# Patient Record
Sex: Male | Born: 1975 | Race: White | Hispanic: Yes | Marital: Single | Smoking: Light tobacco smoker
Health system: Southern US, Community
[De-identification: ages and names within clinical notes are randomized; demographics above are authoritative.]

## PROBLEM LIST (undated history)

## (undated) DIAGNOSIS — IMO0001 Reserved for inherently not codable concepts without codable children: Secondary | ICD-10-CM

## (undated) DIAGNOSIS — Z86010 Personal history of colonic polyps: Secondary | ICD-10-CM

## (undated) DIAGNOSIS — G8929 Other chronic pain: Secondary | ICD-10-CM

## (undated) DIAGNOSIS — M542 Cervicalgia: Secondary | ICD-10-CM

## (undated) DIAGNOSIS — E669 Obesity, unspecified: Secondary | ICD-10-CM

## (undated) DIAGNOSIS — E291 Testicular hypofunction: Secondary | ICD-10-CM

## (undated) DIAGNOSIS — T7840XA Allergy, unspecified, initial encounter: Secondary | ICD-10-CM

## (undated) DIAGNOSIS — R609 Edema, unspecified: Secondary | ICD-10-CM

## (undated) DIAGNOSIS — D649 Anemia, unspecified: Secondary | ICD-10-CM

## (undated) DIAGNOSIS — E785 Hyperlipidemia, unspecified: Secondary | ICD-10-CM

## (undated) DIAGNOSIS — F329 Major depressive disorder, single episode, unspecified: Secondary | ICD-10-CM

## (undated) DIAGNOSIS — J45909 Unspecified asthma, uncomplicated: Secondary | ICD-10-CM

## (undated) DIAGNOSIS — E1169 Type 2 diabetes mellitus with other specified complication: Secondary | ICD-10-CM

## (undated) DIAGNOSIS — M1990 Unspecified osteoarthritis, unspecified site: Secondary | ICD-10-CM

## (undated) DIAGNOSIS — E039 Hypothyroidism, unspecified: Secondary | ICD-10-CM

## (undated) DIAGNOSIS — M1A9XX0 Chronic gout, unspecified, without tophus (tophi): Secondary | ICD-10-CM

## (undated) HISTORY — DX: Hypothyroidism, unspecified: E03.9

## (undated) HISTORY — DX: Obesity, unspecified: E66.9

## (undated) HISTORY — DX: Reserved for inherently not codable concepts without codable children: IMO0001

## (undated) HISTORY — DX: Cervicalgia: M54.2

## (undated) HISTORY — DX: Other chronic pain: G89.29

## (undated) HISTORY — DX: Chronic gout, unspecified, without tophus (tophi): M1A.9XX0

## (undated) HISTORY — PX: SHOULDER SURGERY: SHX246

## (undated) HISTORY — DX: Unspecified osteoarthritis, unspecified site: M19.90

## (undated) HISTORY — DX: Type 2 diabetes mellitus with other specified complication: E11.69

## (undated) HISTORY — DX: Personal history of colonic polyps: Z86.010

## (undated) HISTORY — DX: Testicular hypofunction: E29.1

## (undated) HISTORY — DX: Allergy, unspecified, initial encounter: T78.40XA

## (undated) HISTORY — DX: Anemia, unspecified: D64.9

## (undated) HISTORY — DX: Unspecified asthma, uncomplicated: J45.909

## (undated) HISTORY — DX: Major depressive disorder, single episode, unspecified: F32.9

## (undated) HISTORY — PX: TONSILLECTOMY: SUR1361

## (undated) HISTORY — DX: Edema, unspecified: R60.9

## (undated) HISTORY — DX: Hyperlipidemia, unspecified: E78.5

---

## 2002-02-24 ENCOUNTER — Emergency Department (HOSPITAL_COMMUNITY): Admission: EM | Admit: 2002-02-24 | Discharge: 2002-02-24 | Payer: Self-pay | Admitting: Emergency Medicine

## 2002-02-24 ENCOUNTER — Encounter: Payer: Self-pay | Admitting: Emergency Medicine

## 2007-05-18 HISTORY — PX: SPINE SURGERY: SHX786

## 2008-07-30 ENCOUNTER — Emergency Department (HOSPITAL_COMMUNITY): Admission: EM | Admit: 2008-07-30 | Discharge: 2008-07-30 | Payer: Self-pay | Admitting: Emergency Medicine

## 2008-10-07 ENCOUNTER — Ambulatory Visit (HOSPITAL_COMMUNITY): Admission: RE | Admit: 2008-10-07 | Discharge: 2008-10-08 | Payer: Self-pay | Admitting: Neurological Surgery

## 2010-08-25 LAB — CBC
HCT: 41.7 % (ref 39.0–52.0)
Hemoglobin: 14.6 g/dL (ref 13.0–17.0)
MCHC: 35 g/dL (ref 30.0–36.0)
MCV: 88.6 fL (ref 78.0–100.0)
Platelets: 174 10*3/uL (ref 150–400)
RBC: 4.71 MIL/uL (ref 4.22–5.81)
RDW: 12.7 % (ref 11.5–15.5)
WBC: 5.8 10*3/uL (ref 4.0–10.5)

## 2010-08-27 LAB — DIFFERENTIAL
Eosinophils Relative: 1 % (ref 0–5)
Lymphocytes Relative: 20 % (ref 12–46)
Lymphs Abs: 1.5 10*3/uL (ref 0.7–4.0)
Monocytes Relative: 6 % (ref 3–12)

## 2010-08-27 LAB — CBC
HCT: 41 % (ref 39.0–52.0)
Hemoglobin: 14.4 g/dL (ref 13.0–17.0)
MCHC: 35 g/dL (ref 30.0–36.0)
MCV: 89.2 fL (ref 78.0–100.0)
Platelets: 223 10*3/uL (ref 150–400)
RBC: 4.6 MIL/uL (ref 4.22–5.81)
RDW: 12.4 % (ref 11.5–15.5)
WBC: 7.3 10*3/uL (ref 4.0–10.5)

## 2010-08-27 LAB — POCT I-STAT, CHEM 8
BUN: 10 mg/dL (ref 6–23)
Creatinine, Ser: 0.8 mg/dL (ref 0.4–1.5)
Glucose, Bld: 122 mg/dL — ABNORMAL HIGH (ref 70–99)
Potassium: 3.2 mEq/L — ABNORMAL LOW (ref 3.5–5.1)
Sodium: 140 mEq/L (ref 135–145)
TCO2: 27 mmol/L (ref 0–100)

## 2010-08-27 LAB — POCT CARDIAC MARKERS
CKMB, poc: <1 ng/mL — ABNORMAL LOW (ref 1.0–8.0)
Myoglobin, poc: 72.1 ng/mL (ref 12–200)

## 2010-09-29 NOTE — Op Note (Signed)
NAME:  Jeremiah Long, Jeremiah Long NO.:  [id number]   MEDICAL RECORD NO.:  [id number]          PATIENT TYPE:  OIB   LOCATION:  3524                         FACILITY:  [facility]   PHYSICIAN:  Stefani Dama, M.D.  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   POSTOPERATIVE DIAGNOSIS:  Herniated nucleus pulposus L4-L5 left with  left lumbar radiculopathy.   OPERATIONS:  Microdiskectomy L4-L5 left with operating microscope  microdissection technique.   SURGEON:  Barnett Abu, M.D.   FIRST ASSISTANT:  Danae Orleans. Venetia Maxon, M.D.   ANESTHESIA:  General endotracheal.   INDICATIONS:  Mr. Ratterman is a 35 year old individual who has had  significant back and left lower extremity pain.  This started back in  January and evidence of a large extruded fragment disk at L4-L5 on the  left side.  Despite efforts at conservative management, his symptoms  initially improved, but he has been left with some chronic back pain,  buttock pain and pain occasional radiating into the left lower  extremity, particularly when he stands for any length of time.  Having  failed efforts at conservative management over the past months, he has  been advised regarding surgical decompression of the disk at L4-L5 on  the left.   PROCEDURE:  The patient was brought to the operating room, placed on the  stretcher in the supine position.  After smooth induction of general  endotracheal anesthesia, he was turned prone onto the operating table.  The back was carefully padded and protected and the back was then  prepped with alcohol and DuraPrep and draped in sterile fashion.  After  localizing the region of L4-L5 by palpation, a spinal needle was placed  to verify the localization radiographically and indeed the interlaminar  space was  noted at L4-L5.  A small vertical incision was created in the  midline of lumbar spine.  This was carried down to lumbodorsal fascia,  which was opened on the left side of midline.  Subperiosteal dissection  was then performed in the interlaminar space that was marked as L4-L5.  Self-retaining Caspar retractor was then placed into the wound and  dissection was then carried cephalad and caudad to expose the  interlaminar space fully.  Second verifying x-ray was obtained with an  instrument in the interspace at L4-L5.  Then, laminotomy was created  removing the inferior margin lamina of L4 out to the medial wall of  facet, partial medial facetectomy was performed in this region.  The  superior arch of L5 was similarly resected and the common dural tube was  exposed along with takeoff the L5 nerve root which was noted to be bowed  dorsally and laterally.  By dissecting the nerve root medially, there  was noted to be a mass of disk material that was unplugged to the  vertebral body.  This was carefully dissected free and removed.  This  allowed better egress of the L5 nerve root.  Medially,  there was noted  to be some other fragments of disk.  Behind the body of L5 were  partially adherent to the dura and also confined by epidural veins.  Once this was resected, some brisk bleeding was encountered which was  controlled with some bipolar cautery and some pledgets of Gelfoam soaked  in thrombin.  The disk space was then explored.  There was noted to be  an opening on the inferior medial aspect of the space.  A #15 blade was  used to increase the size of the opening and then resect significant  quantities of significantly degenerated disk material from within the  disk space, both medial and lateral dissection was performed, and once  this was completed 2 and 3 mm Kerrison punch was used to dress the edges  of the posterior longitudinal ligament in this area.  The common dural  tube and the L5  nerve root were well decompressed.  The disk space was  evacuated and this was checked by repeated irrigation and probing with  several sizes of blunt nerve hooks and once this was verified, the  interspace was decompressed.  Retractors were removed.  Microscope was  removed.  The lumbodorsal fascia was closed with #1 Vicryl in  interrupted fashion, 2-0 Vicryl using subcutaneous tissues, 3-0 Vicryl  subcuticularly and Dermabond was placed on the skin.  Blood loss for the  procedure was estimated at 50 mL.  The patient tolerated the procedure  well.      Stefani Dama, M.D.  Electronically Signed     HJE/MEDQ  D:  10/07/2008  T:  10/08/2008  Job:  [id number]

## 2011-11-02 ENCOUNTER — Encounter: Payer: Self-pay | Admitting: Family Medicine

## 2011-11-02 ENCOUNTER — Ambulatory Visit (INDEPENDENT_AMBULATORY_CARE_PROVIDER_SITE_OTHER): Admitting: Family Medicine

## 2011-11-02 VITALS — BP 132/81 | HR 92 | Ht 71.0 in | Wt 280.0 lb

## 2011-11-02 DIAGNOSIS — Z86010 Personal history of colonic polyps: Secondary | ICD-10-CM

## 2011-11-02 DIAGNOSIS — E039 Hypothyroidism, unspecified: Secondary | ICD-10-CM | POA: Insufficient documentation

## 2011-11-02 DIAGNOSIS — M1A9XX0 Chronic gout, unspecified, without tophus (tophi): Secondary | ICD-10-CM | POA: Insufficient documentation

## 2011-11-02 DIAGNOSIS — E669 Obesity, unspecified: Secondary | ICD-10-CM

## 2011-11-02 DIAGNOSIS — E119 Type 2 diabetes mellitus without complications: Secondary | ICD-10-CM

## 2011-11-02 DIAGNOSIS — E785 Hyperlipidemia, unspecified: Secondary | ICD-10-CM

## 2011-11-02 DIAGNOSIS — E1169 Type 2 diabetes mellitus with other specified complication: Secondary | ICD-10-CM

## 2011-11-02 DIAGNOSIS — Z860101 Personal history of adenomatous and serrated colon polyps: Secondary | ICD-10-CM

## 2011-11-02 DIAGNOSIS — E291 Testicular hypofunction: Secondary | ICD-10-CM

## 2011-11-02 DIAGNOSIS — E876 Hypokalemia: Secondary | ICD-10-CM

## 2011-11-02 DIAGNOSIS — M542 Cervicalgia: Secondary | ICD-10-CM

## 2011-11-02 DIAGNOSIS — M1A00X0 Idiopathic chronic gout, unspecified site, without tophus (tophi): Secondary | ICD-10-CM

## 2011-11-02 HISTORY — DX: Type 2 diabetes mellitus with other specified complication: E11.69

## 2011-11-02 HISTORY — DX: Hypothyroidism, unspecified: E03.9

## 2011-11-02 HISTORY — DX: Type 2 diabetes mellitus with other specified complication: E66.9

## 2011-11-02 HISTORY — DX: Chronic gout, unspecified, without tophus (tophi): M1A.9XX0

## 2011-11-02 HISTORY — DX: Testicular hypofunction: E29.1

## 2011-11-02 HISTORY — DX: Personal history of colonic polyps: Z86.010

## 2011-11-02 HISTORY — DX: Personal history of adenomatous and serrated colon polyps: Z86.0101

## 2011-11-02 HISTORY — DX: Hyperlipidemia, unspecified: E78.5

## 2011-11-02 LAB — POCT GLYCOSYLATED HEMOGLOBIN (HGB A1C): Hemoglobin A1C: 6.4

## 2011-11-02 MED ORDER — MELOXICAM 15 MG PO TABS: 15.0000 mg | ORAL_TABLET | Freq: Every day | ORAL | Status: DC

## 2011-11-02 MED ORDER — COLESEVELAM HCL 625 MG PO TABS: 1875.0000 mg | ORAL_TABLET | Freq: Two times a day (BID) | ORAL | Status: DC

## 2011-11-02 MED ORDER — COLCHICINE 0.6 MG PO TABS: 0.6000 mg | ORAL_TABLET | Freq: Every day | ORAL | Status: DC

## 2011-11-02 MED ORDER — METFORMIN HCL ER (OSM) 1000 MG PO TB24: 2000.0000 mg | ORAL_TABLET | Freq: Every day | ORAL | Status: DC

## 2011-11-02 MED ORDER — POTASSIUM CHLORIDE 20 MEQ PO PACK: 40.0000 meq | PACK | Freq: Two times a day (BID) | ORAL | Status: DC

## 2011-11-02 MED ORDER — ORPHENADRINE CITRATE ER 100 MG PO TB12: 100.0000 mg | ORAL_TABLET | Freq: Two times a day (BID) | ORAL | Status: DC

## 2011-11-02 MED ORDER — TESTOSTERONE CYPIONATE 200 MG/ML IM SOLN: 200.0000 mg | INTRAMUSCULAR | Status: DC

## 2011-11-02 MED ORDER — FEBUXOSTAT 80 MG PO TABS: 30.0000 mg | ORAL_TABLET | ORAL | Status: DC

## 2011-11-02 MED ORDER — LEVOTHYROXINE SODIUM 150 MCG PO TABS: 150.0000 ug | ORAL_TABLET | Freq: Every day | ORAL | Status: DC

## 2011-11-02 NOTE — Progress Notes (Signed)
Subjective:    Patient ID: Jeremiah Long, male    DOB: 05/29/1975, 36 y.o.   MRN: [id number]  HPI Patient is a new patient here with multiple issues. #1 Lyme's disease. He was treated with several cyclic antibiotic trials for late systemic Lyme's disease. He states when he was initially treated about 6 months ago he felt better but since then he has felt somewhat fatigued he's had before and questions whether he should be retreated. He is a lot of lab work including Lyme's disease titers which he says looks better but still there is some seropositive findings present. #2 hypokalemia potassium on the lab work is low 3.2 he is taking potassium 2 tablets daily. He is also taking Lasix 40 mg one to 2 tablets every morning for persistent and recurrent edema #3 elevated blood sugar. He is on long-acting metformin but only 750 mg twice a day. It is it looks like he may been on 6 pills a day but he does the cyst is only taking 2 #4 history of hypothyroidism. He's taking Armour Thyroid and another thyroid supplement as well. He has been going to a wellness clinic and weight loss clinic. States that the weight loss was good initially but then plateaued and the NP there had him on 2 thyroid medications. #5 failed weight loss. States the weight loss is no longer coming off as it did before. He has been taking phentermine, HCG injections on a cycle. He states because of his chronic pain he is unable to exercise on a regular basis. #6 hypogonadism. He is on an extensive dose of testosterone which is injecting twice a week and showed that his testosterone levels are now adequate but he is also taking Cytomel 25 mcg 2 tablets twice a day and antiestrogen preparation #7 chronic pain. He states that the Flexeril and tramadol no longer works for his chronic pain. Should be noted he does have a history of according to him bulging disc of the neck and he has had back surgery as well. He states that the orthopedic"cut  him off"of his pain medication years ago. #8 in reviewing his labs He is uric acid greater than 11 and he's had swelling of his hand arms and legs but is not taking anything for gout at this time.  #9 fatigue. He reports taking B12 injections twice a week to improve his stamina and his fatigue he states he does not mind injecting himself twice a week for that. #10 hyperlipidemia in particular elevated triglycerides. He is not on any treatment for that issue at this time. #11 rash on his chest and arm.   Review of Systems  Constitutional: Positive for activity change, appetite change, fatigue and unexpected weight change.  Musculoskeletal: Positive for myalgias and arthralgias.  Skin: Positive for rash.  Neurological: Positive for weakness, light-headedness and headaches.      BP 132/81  Pulse 92  Ht 5\' 11"  (1.803 m)  Wt 280 lb (127.007 kg)  BMI 39.05 kg/m2 Objective:   Physical Exam  Constitutional: He is oriented to person, place, and time. He appears well-developed and well-nourished.  HENT:  Head: Normocephalic and atraumatic.  Eyes: Pupils are equal, round, and reactive to light.  Neck: Normal range of motion. Neck supple. No tracheal deviation present. No thyromegaly present.  Cardiovascular: Normal rate, regular rhythm and normal heart sounds.  Exam reveals no friction rub.   No murmur heard. Pulmonary/Chest: Effort normal and breath sounds normal. No respiratory distress. He has  no wheezes.  Musculoskeletal: Normal range of motion. He exhibits no edema.  Neurological: He is alert and oriented to person, place, and time.  Skin: Rash noted. There is erythema.       Acne like lesions on both arms chest and abdomen  Psychiatric: He has a normal mood and affect. His behavior is normal.       Lab Results  Component Value Date   HGBA1C 6.4 11/02/2011   Assessment & Plan:     Itch#13####  #1 Lyme's disease and exposure. Would not deny that he has had Lyme's disease the  question is does he still required treatment and so how much. We will await until his next visit to make that determination.  #2 will allow him for the time being to continue with his Lasix 40 mg one to 2 tablets daily but we will increase his potassium to 2 tablets twice a day and the next time blood work was drawn may need to include a magnesium level as well. #3 elevated blood sugar. He was told that he is a diabetic with an A1c on medication of 6.4 he meets the criteria. We'll increase his Fortamet from 750 twice a day to two 1000 mg tablets of Fortamet daily. He may be a candidate for Victoza later. #4 the treatment of hypothyroidism with 2 thyroid preparations goes against my training in medicine. Stop the Armour and the Cytomel now and we'll place on Synthroid 150 mcg a day. #5 failed weight loss/obesity. No phentermine, no hCG at this time and he would not be able to get hCG from me #6 hypogonadism. Allow current testosterone dosing at this time twice a week and we'll need to monitor testosterone levels but I would not approve a known him continuing the antiestrogen medication unless he is transgender. #7 chronic pain. Stop the Flexeril continue the tramadol and add Mobic 15 mg daily as well as Norflex 100 mg twice a day we'll see him pain does next month.  #8 gout. He meets the criteria of joint pain hand pain arm pain knee pain which is episodic and a uric acid greater than 11. We'll place him on colchicine for least 2 months one tablet once or twice a day warned of the problems of diarrhea and place him on Uloric 80 mg daily with coupons. Stress the need to take colchicine for least the first 2 months of taking Uloric. #9 at this point time fatigue may be secondary to some of the medications were taking him off no new changes at this time. #10 hyperlipidemia/hypertriglyceridemia. We'll place him on WelChol 625 mg 2 capsules 3 times a day and recheck this in appropriate time. Explained to him and  his wife that the WelChol she also help with his diabetes. #11 rash on chest may be secondary to hormonal treatment since this is being changed no new treatment for this at this time. Should be noted that the complexity and multiple problems over half the time was spent in counseling and explaining the changes Been done Patient as leaving request a GI referral for his history of colonic adenomatous polyps that is due for repeat colonoscopy now.

## 2011-11-02 NOTE — Patient Instructions (Signed)
Gout Gout is an inflammatory condition (arthritis) caused by a buildup of uric acid crystals in the joints. Uric acid is a chemical that is normally present in the blood. Under some circumstances, uric acid can form into crystals in your joints. This causes joint redness, soreness, and swelling (inflammation). Repeat attacks are common. Over time, uric acid crystals can form into masses (tophi) near a joint, causing disfigurement. Gout is treatable and often preventable. CAUSES  The disease begins with elevated levels of uric acid in the blood. Uric acid is produced by your body when it breaks down a naturally found substance called purines. This also happens when you eat certain foods such as meats and fish. Causes of an elevated uric acid level include:  Being passed down from parent to child (heredity).   Diseases that cause increased uric acid production (obesity, psoriasis, some cancers).   Excessive alcohol use.   Diet, especially diets rich in meat and seafood.   Medicines, including certain cancer-fighting drugs (chemotherapy), diuretics, and aspirin.   Chronic kidney disease. The kidneys are no longer able to remove uric acid well.   Problems with metabolism.  Conditions strongly associated with gout include:  Obesity.   High blood pressure.   High cholesterol.   Diabetes.  Not everyone with elevated uric acid levels gets gout. It is not understood why some people get gout and others do not. Surgery, joint injury, and eating too much of certain foods are some of the factors that can lead to gout. SYMPTOMS   An attack of gout comes on quickly. It causes intense pain with redness, swelling, and warmth in a joint.   Fever can occur.   Often, only one joint is involved. Certain joints are more commonly involved:   Base of the big toe.   Knee.   Ankle.   Wrist.   Finger.  Without treatment, an attack usually goes away in a few days to weeks. Between attacks, you  usually will not have symptoms, which is different from many other forms of arthritis. DIAGNOSIS  Your caregiver will suspect gout based on your symptoms and exam. Removal of fluid from the joint (arthrocentesis) is done to check for uric acid crystals. Your caregiver will give you a medicine that numbs the area (local anesthetic) and use a needle to remove joint fluid for exam. Gout is confirmed when uric acid crystals are seen in joint fluid, using a special microscope. Sometimes, blood, urine, and X-ray tests are also used. TREATMENT  There are 2 phases to gout treatment: treating the sudden onset (acute) attack and preventing attacks (prophylaxis). Treatment of an Acute Attack  Medicines are used. These include anti-inflammatory medicines or steroid medicines.   An injection of steroid medicine into the affected joint is sometimes necessary.   The painful joint is rested. Movement can worsen the arthritis.   You may use warm or cold treatments on painful joints, depending which works best for you.   Discuss the use of coffee, vitamin C, or cherries with your caregiver. These may be helpful treatment options.  Treatment to Prevent Attacks After the acute attack subsides, your caregiver may advise prophylactic medicine. These medicines either help your kidneys eliminate uric acid from your body or decrease your uric acid production. You may need to stay on these medicines for a very long time. The early phase of treatment with prophylactic medicine can be associated with an increase in acute gout attacks. For this reason, during the first few months   of treatment, your caregiver may also advise you to take medicines usually used for acute gout treatment. Be sure you understand your caregiver's directions. You should also discuss dietary treatment with your caregiver. Certain foods such as meats and fish can increase uric acid levels. Other foods such as dairy can decrease levels. Your caregiver  can give you a list of foods to avoid. HOME CARE INSTRUCTIONS   Do not take aspirin to relieve pain. This raises uric acid levels.   Only take over-the-counter or prescription medicines for pain, discomfort, or fever as directed by your caregiver.   Rest the joint as much as possible. When in bed, keep sheets and blankets off painful areas.   Keep the affected joint raised (elevated).   Use crutches if the painful joint is in your leg.   Drink enough water and fluids to keep your urine clear or pale yellow. This helps your body get rid of uric acid. Do not drink alcoholic beverages. They slow the passage of uric acid.   Follow your caregiver's dietary instructions. Pay careful attention to the amount of protein you eat. Your daily diet should emphasize fruits, vegetables, whole grains, and fat-free or low-fat milk products.   Maintain a healthy body weight.  SEEK MEDICAL CARE IF:   You have an oral temperature above 102 F (38.9 C).   You develop diarrhea, vomiting, or any side effects from medicines.   You do not feel better in 24 hours, or you are getting worse.  SEEK IMMEDIATE MEDICAL CARE IF:   Your joint becomes suddenly more tender and you have:   Chills.   An oral temperature above 102 F (38.9 C), not controlled by medicine.  MAKE SURE YOU:   Understand these instructions.   Will watch your condition.   Will get help right away if you are not doing well or get worse.  Document Released: 04/30/2000 Document Revised: 04/22/2011 Document Reviewed: 08/11/2009 [facility] Patient Information 2012 [place], [place].Hypothyroidism The thyroid is a large gland located in the lower front of your neck. The thyroid gland helps control metabolism. Metabolism is how your body handles food. It controls metabolism with the hormone thyroxine. When this gland is underactive (hypothyroid), it produces too little hormone.  CAUSES These include:   Absence or destruction of thyroid  tissue.   Goiter due to iodine deficiency.   Goiter due to medications.   Congenital defects (since birth).   Problems with the pituitary. This causes a lack of TSH (thyroid stimulating hormone). This hormone tells the thyroid to turn out more hormone.  SYMPTOMS  Lethargy (feeling as though you have no energy)   Cold intolerance   Weight gain (in spite of normal food intake)   Dry skin   Coarse hair   Menstrual irregularity (if severe, may lead to infertility)   Slowing of thought processes  Cardiac problems are also caused by insufficient amounts of thyroid hormone. Hypothyroidism in the newborn is cretinism, and is an extreme form. It is important that this form be treated adequately and immediately or it will lead rapidly to retarded physical and mental development. DIAGNOSIS  To prove hypothyroidism, your caregiver may do blood tests and ultrasound tests. Sometimes the signs are hidden. It may be necessary for your caregiver to watch this illness with blood tests either before or after diagnosis and treatment. TREATMENT  Low levels of thyroid hormone are increased by using synthetic thyroid hormone. This is a safe, effective treatment. It usually takes about four  weeks to gain the full effects of the medication. After you have the full effect of the medication, it will generally take another four weeks for problems to leave. Your caregiver may start you on low doses. If you have had heart problems the dose may be gradually increased. It is generally not an emergency to get rapidly to normal. HOME CARE INSTRUCTIONS   Take your medications as your caregiver suggests. Let your caregiver know of any medications you are taking or start taking. Your caregiver will help you with dosage schedules.   As your condition improves, your dosage needs may increase. It will be necessary to have continuing blood tests as suggested by your caregiver.   Report all suspected medication side  effects to your caregiver.  SEEK MEDICAL CARE IF: Seek medical care if you develop:  Sweating.   Tremulousness (tremors).   Anxiety.   Rapid weight loss.   Heat intolerance.   Emotional swings.   Diarrhea.   Weakness.  SEEK IMMEDIATE MEDICAL CARE IF:  You develop chest pain, an irregular heart beat (palpitations), or a rapid heart beat. MAKE SURE YOU:   Understand these instructions.   Will watch your condition.   Will get help right away if you are not doing well or get worse.  Document Released: 05/03/2005 Document Revised: 04/22/2011 Document Reviewed: 12/22/2007 [facility] Patient Information 2012 [place], [place].

## 2011-12-02 ENCOUNTER — Ambulatory Visit: Admitting: Family Medicine

## 2011-12-02 ENCOUNTER — Encounter: Payer: Self-pay | Admitting: Family Medicine

## 2011-12-02 ENCOUNTER — Ambulatory Visit (INDEPENDENT_AMBULATORY_CARE_PROVIDER_SITE_OTHER): Admitting: Family Medicine

## 2011-12-02 ENCOUNTER — Other Ambulatory Visit: Payer: Self-pay | Admitting: *Deleted

## 2011-12-02 VITALS — BP 136/77 | HR 85 | Wt 276.0 lb

## 2011-12-02 DIAGNOSIS — M1A9XX0 Chronic gout, unspecified, without tophus (tophi): Secondary | ICD-10-CM

## 2011-12-02 DIAGNOSIS — E119 Type 2 diabetes mellitus without complications: Secondary | ICD-10-CM

## 2011-12-02 DIAGNOSIS — M1A00X0 Idiopathic chronic gout, unspecified site, without tophus (tophi): Secondary | ICD-10-CM

## 2011-12-02 DIAGNOSIS — R5381 Other malaise: Secondary | ICD-10-CM

## 2011-12-02 DIAGNOSIS — G8929 Other chronic pain: Secondary | ICD-10-CM

## 2011-12-02 DIAGNOSIS — E669 Obesity, unspecified: Secondary | ICD-10-CM

## 2011-12-02 DIAGNOSIS — E291 Testicular hypofunction: Secondary | ICD-10-CM

## 2011-12-02 DIAGNOSIS — E785 Hyperlipidemia, unspecified: Secondary | ICD-10-CM

## 2011-12-02 DIAGNOSIS — R5383 Other fatigue: Secondary | ICD-10-CM

## 2011-12-02 DIAGNOSIS — M542 Cervicalgia: Secondary | ICD-10-CM

## 2011-12-02 DIAGNOSIS — IMO0001 Reserved for inherently not codable concepts without codable children: Secondary | ICD-10-CM

## 2011-12-02 LAB — POCT CBG (FASTING - GLUCOSE)-MANUAL ENTRY: Glucose Fasting, POC: 82 mg/dL (ref 70–99)

## 2011-12-02 MED ORDER — PHENTERMINE HCL 37.5 MG PO CAPS: 37.5000 mg | ORAL_CAPSULE | ORAL | Status: DC

## 2011-12-02 MED ORDER — FEBUXOSTAT 80 MG PO TABS: 80.0000 mg | ORAL_TABLET | ORAL | Status: DC

## 2011-12-02 MED ORDER — FUROSEMIDE 40 MG PO TABS: 40.0000 mg | ORAL_TABLET | Freq: Every day | ORAL | Status: DC | PRN

## 2011-12-02 MED ORDER — FEBUXOSTAT 80 MG PO TABS: 80.0000 mg | ORAL_TABLET | Freq: Every day | ORAL | Status: DC

## 2011-12-02 MED ORDER — HYDROCODONE-ACETAMINOPHEN 5-325 MG PO TABS: 1.0000 | ORAL_TABLET | Freq: Three times a day (TID) | ORAL | Status: AC | PRN

## 2011-12-02 NOTE — Patient Instructions (Signed)
Chronic Back Pain When back pain lasts longer than 3 months, it is called chronic back pain.This pain can be frustrating, but the cause of the pain is rarely dangerous.People with chronic back pain often go through certain periods that are more intense (flare-ups). CAUSES Chronic back pain can be caused by wear and tear (degeneration) on different structures in your back. These structures may include bones, ligaments, or discs. This degeneration may result in more pressure being placed on the nerves that travel to your legs and feet. This can lead to pain traveling from the low back down the back of the legs. When pain lasts longer than 3 months, it is not unusual for people to experience anxiety or depression. Anxiety and depression can also contribute to low back pain. TREATMENT  Establish a regular exercise plan. This is critical to improving your functional level.   Have a self-management plan for when you flare-up. Flare-ups rarely require a medical visit. Regular exercise will help reduce the intensity and frequency of your flare-ups.   Manage how you feel about your back pain and the rest of your life. Anxiety, depression, and feeling that you cannot alter your back pain have been shown to make back pain more intense and debilitating.   Medicines should never be your only treatment. They should be used along with other treatments to help you return to a more active lifestyle.   Procedures such as injections or surgery may be helpful but are rarely necessary. You may be able to get the same results with physical therapy or chiropractic care.  HOME CARE INSTRUCTIONS  Avoid bending, heavy lifting, prolonged sitting, and activities which make the problem worse.   Continue normal activity as much as possible.   Take brief periods of rest throughout the day to reduce your pain during flare-ups.   Follow your back exercise rehabilitation program. This can help reduce symptoms and prevent  more pain.   Only take over-the-counter or prescription medicines as directed by your caregiver. Muscle relaxants are sometimes prescribed. Narcotic pain medicine is discouraged for long-term pain, since addiction is a possible outcome.   If you smoke, quit.   Eat healthy foods and maintain a recommended body weight.  SEEK IMMEDIATE MEDICAL CARE IF:   You have weakness or numbness in one of your legs or feet.   You have trouble controlling your bladder or bowels.   You develop nausea, vomiting, abdominal pain, shortness of breath, or fainting.  Document Released: 06/10/2004 Document Revised: 04/22/2011 Document Reviewed: 04/17/2011 [employer] Patient Information 2012 [employer]. 

## 2011-12-02 NOTE — Progress Notes (Signed)
Subjective:    Patient ID: Jeremiah Long, male    DOB: Feb 04, 1976, 36 y.o.   MRN: [id number]  HPI  #1 diabetes. He states he is taking his metformin thousand milligrams twice a day blood sugars under 100 today  #2 he repeatedly mentions today about the chronic pain that he's been having states because of the pain he has been unable to focus or concentrate on many things including his job that he has to go because of his inability to function. States that the tramadol has not been effective in controlling his pain. This pain is the cervical pain that he was told was secondary to a bulging disc. #3 fatigue he also reports increased fatigue and tiredness. We changed some of his medications and concern about which one is really taking Cytotec we need to reevaluate all his medications #4 obesity. We had him on phentermine which she states he never got. We'll renew her phentermine at this time   #5 gout. There appears to be  an error in his Uloric dosage. We'll try to correct that #6 edema. I might note we kept him on Lasix he states he he never got the Lasix will renew that prescription #7 hyperlipidemia he has fasting and will get a cholesterol panel on him today as well #8 colonic polyp.    Review of Systems  Constitutional: Positive for activity change, appetite change, fatigue and unexpected weight change.  Musculoskeletal: Positive for myalgias, back pain and arthralgias.  Psychiatric/Behavioral: Positive for dysphoric mood. The patient is nervous/anxious.       BP 136/77  Pulse 85  Wt 276 lb (125.193 kg)  SpO2 96% Objective:   Physical Exam  Vitals reviewed. Constitutional: He is oriented to person, place, and time. He appears well-developed and well-nourished.       Obese Hispanic male.  HENT:  Head: Normocephalic.  Eyes: Pupils are equal, round, and reactive to light.  Neck: Neck supple.  Cardiovascular: Normal rate, regular rhythm and normal heart sounds.     Pulmonary/Chest: Effort normal and breath sounds normal.  Neurological: He is alert and oriented to person, place, and time.  Skin: Skin is warm and dry. No erythema.  Psychiatric: His mood appears anxious. He exhibits a depressed mood.      Assessment & Plan:  #1   #1 diabetes. That appears to be doing better. Continue with the metformin thousand milligrams twice a day. #2 neck pain, joint pain, chronic pain. We will have him stop the tramadol we'll place him on low dose of hydrocodone 5-25 tried to explain to him my concern because of addictive nature of hydrocodone we'll only give him to not take 1 tablet twice a day. #3 fatigue. Concerned about the fatigue may still be secondary to all of the hormonal changes since we put him on the estrogen blocking agent. 2 he still taking his testosterone twice a day. We have changed him from 2 thyroid medications to thyroid medication. We will get a TSH, B12, CBC, chronic fatigue panel as well as his other lab tests such as testosterone free and total. I really don't think his Lyme's disease is significant at this time it has been treated at least 3-4 times in the last few years with a month of antibiotics and he only has one other markers positive now which he did admit he used to have at least 2 or 3 markers positive. #4 hyperlipidemia since he's fasting we'll get a cholesterol and lipid panel. #5  gout. We'll check a uric acid level on him as well.  #6 he did get to see the GI doctor and they did remove a polyp. Followup with them in 3-5 years.  #7 edema. He states it did not give him a flu medicine I thought we did we'll renew his Lasix he can take every day with potassium. This was also a 40 mg Lasix tablet. #8 obesity. Renew his phentermine as well. Due to the extensive nature of this visit patient will return in 2 weeks for reevaluation and should be noted that out of the 45 minute billed for this visit over half was spent in counseling reviewing  medications and disease management. He was asked since there appears to be some confusion over his medication to please bring all of his medication and to be looked at in 2 weeks.

## 2011-12-03 LAB — CBC WITH DIFFERENTIAL/PLATELET
Basophils Relative: 0 % (ref 0–1)
Eosinophils Absolute: 0.1 10*3/uL (ref 0.0–0.7)
HCT: 44.1 % (ref 39.0–52.0)
Hemoglobin: 15 g/dL (ref 13.0–17.0)
MCH: 29.9 pg (ref 26.0–34.0)
MCHC: 34 g/dL (ref 30.0–36.0)
Monocytes Absolute: 0.5 10*3/uL (ref 0.1–1.0)
Monocytes Relative: 6 % (ref 3–12)
Neutro Abs: 4.1 10*3/uL (ref 1.7–7.7)

## 2011-12-03 LAB — COMPREHENSIVE METABOLIC PANEL
ALT: 53 U/L (ref 0–53)
AST: 29 U/L (ref 0–37)
Albumin: 4.6 g/dL (ref 3.5–5.2)
Calcium: 9.6 mg/dL (ref 8.4–10.5)
Chloride: 103 mEq/L (ref 96–112)
Potassium: 4.1 mEq/L (ref 3.5–5.3)
Total Protein: 7.2 g/dL (ref 6.0–8.3)

## 2011-12-03 LAB — EPSTEIN-BARR VIRUS NUCLEAR ANTIGEN ANTIBODY, IGG: EBV NA IgG: >600 U/mL — ABNORMAL HIGH (ref <18.0)

## 2011-12-03 LAB — EPSTEIN-BARR VIRUS EARLY D ANTIGEN ANTIBODY, IGG: EBV EA IgG: 8.1 U/mL (ref <9.0)

## 2011-12-03 LAB — LIPID PANEL

## 2011-12-03 LAB — MAGNESIUM: Magnesium: 1.8 mg/dL (ref 1.5–2.5)

## 2011-12-16 ENCOUNTER — Encounter: Payer: Self-pay | Admitting: Family Medicine

## 2011-12-16 ENCOUNTER — Ambulatory Visit: Admitting: Family Medicine

## 2011-12-16 ENCOUNTER — Ambulatory Visit (INDEPENDENT_AMBULATORY_CARE_PROVIDER_SITE_OTHER): Admitting: Family Medicine

## 2011-12-16 VITALS — BP 147/94 | HR 113 | Ht 71.0 in | Wt 272.0 lb

## 2011-12-16 DIAGNOSIS — E291 Testicular hypofunction: Secondary | ICD-10-CM

## 2011-12-16 DIAGNOSIS — L989 Disorder of the skin and subcutaneous tissue, unspecified: Secondary | ICD-10-CM

## 2011-12-16 DIAGNOSIS — E669 Obesity, unspecified: Secondary | ICD-10-CM

## 2011-12-16 DIAGNOSIS — E785 Hyperlipidemia, unspecified: Secondary | ICD-10-CM

## 2011-12-16 DIAGNOSIS — M542 Cervicalgia: Secondary | ICD-10-CM

## 2011-12-16 DIAGNOSIS — E119 Type 2 diabetes mellitus without complications: Secondary | ICD-10-CM

## 2011-12-16 DIAGNOSIS — R609 Edema, unspecified: Secondary | ICD-10-CM

## 2011-12-16 DIAGNOSIS — M1A9XX0 Chronic gout, unspecified, without tophus (tophi): Secondary | ICD-10-CM

## 2011-12-16 DIAGNOSIS — G8929 Other chronic pain: Secondary | ICD-10-CM | POA: Insufficient documentation

## 2011-12-16 DIAGNOSIS — IMO0001 Reserved for inherently not codable concepts without codable children: Secondary | ICD-10-CM

## 2011-12-16 DIAGNOSIS — F32A Depression, unspecified: Secondary | ICD-10-CM | POA: Insufficient documentation

## 2011-12-16 DIAGNOSIS — E039 Hypothyroidism, unspecified: Secondary | ICD-10-CM

## 2011-12-16 DIAGNOSIS — M1A00X0 Idiopathic chronic gout, unspecified site, without tophus (tophi): Secondary | ICD-10-CM

## 2011-12-16 DIAGNOSIS — F329 Major depressive disorder, single episode, unspecified: Secondary | ICD-10-CM | POA: Insufficient documentation

## 2011-12-16 DIAGNOSIS — E1169 Type 2 diabetes mellitus with other specified complication: Secondary | ICD-10-CM

## 2011-12-16 HISTORY — DX: Depression, unspecified: F32.A

## 2011-12-16 HISTORY — DX: Other chronic pain: G89.29

## 2011-12-16 HISTORY — DX: Edema, unspecified: R60.9

## 2011-12-16 HISTORY — DX: Reserved for inherently not codable concepts without codable children: IMO0001

## 2011-12-16 MED ORDER — METFORMIN HCL ER (OSM) 1000 MG PO TB24: 2000.0000 mg | ORAL_TABLET | Freq: Every day | ORAL | Status: DC

## 2011-12-16 MED ORDER — LEVOTHYROXINE SODIUM 150 MCG PO TABS: 150.0000 ug | ORAL_TABLET | Freq: Every day | ORAL | Status: DC

## 2011-12-16 MED ORDER — FEBUXOSTAT 80 MG PO TABS: 80.0000 mg | ORAL_TABLET | Freq: Every day | ORAL | Status: DC

## 2011-12-16 MED ORDER — POTASSIUM CHLORIDE CRYS ER 20 MEQ PO TBCR: 20.0000 meq | EXTENDED_RELEASE_TABLET | Freq: Two times a day (BID) | ORAL | Status: DC

## 2011-12-16 MED ORDER — OXYCODONE-ACETAMINOPHEN 5-325 MG PO TABS: 1.0000 | ORAL_TABLET | Freq: Three times a day (TID) | ORAL | Status: DC | PRN

## 2011-12-16 MED ORDER — COLESEVELAM HCL 625 MG PO TABS: 1875.0000 mg | ORAL_TABLET | Freq: Two times a day (BID) | ORAL | Status: DC

## 2011-12-16 MED ORDER — PHENTERMINE HCL 37.5 MG PO CAPS: 37.5000 mg | ORAL_CAPSULE | ORAL | Status: DC

## 2011-12-16 MED ORDER — FUROSEMIDE 40 MG PO TABS: 40.0000 mg | ORAL_TABLET | Freq: Every day | ORAL | Status: DC | PRN

## 2011-12-16 MED ORDER — COLCHICINE 0.6 MG PO TABS: 0.6000 mg | ORAL_TABLET | Freq: Every day | ORAL | Status: DC

## 2011-12-16 MED ORDER — ORPHENADRINE CITRATE ER 100 MG PO TB12: 100.0000 mg | ORAL_TABLET | Freq: Two times a day (BID) | ORAL | Status: DC

## 2011-12-16 MED ORDER — BUPROPION HCL ER (XL) 300 MG PO TB24: 300.0000 mg | ORAL_TABLET | ORAL | Status: DC

## 2011-12-16 MED ORDER — TESTOSTERONE CYPIONATE 200 MG/ML IM SOLN: 200.0000 mg | INTRAMUSCULAR | Status: DC

## 2011-12-16 MED ORDER — MELOXICAM 15 MG PO TABS: 15.0000 mg | ORAL_TABLET | Freq: Every day | ORAL | Status: DC

## 2011-12-16 NOTE — Progress Notes (Signed)
Subjective:    Patient ID: Jeremiah Long, male    DOB: 08-28-1975, 36 y.o.   MRN: [id number]  HPI #1 chronic muscle pain. States that the Vicodin caused him to it and is allergic to morphine. We will place him on Percocet 5-25 one tablet twice a day will give him a month's worth. #2 diabetes blood sugars in the morning have come down he is still concerned about taking the metformin thousand milligrams twice a day. Will consider Victoza if A1c is not better. #3 gout. Will probably need to check a uric acid when he returns. I have recommended he takes the colchicine for one more month. #4 patient was referred for colonoscopy which was finally done. He reports only 3 polyps removed and overall much better than previously. #5 hyperlipidemia. He has informed me that the WelChol was the most expensive of his medication. He is taking it now. Triglycerides reviewed and about 500 now we'll recheck cholesterol in about 4 months. #6 hypothyroidism. We have stopped to his medications the Cytomel and the thyroid Armour and instead have him on Synthroid 0.15 mg a day because the TSH is still slightly elevated we'll repeat this in one month. #7 obesity. He is frustrated on the weight loss so far with phentermine since we have stopped his  H. CG. In reviewing his chart he has lost over 10 pounds in 2 months. Encourage him to continue his phentermine. #8 edema still more than he would like but he is able to get his rings off and on his fingers now. At this point time I will allow him to take 40-80 mg of Lasix daily. #9 depression he reports that disturbance , some swelling feel depressed. He reports that S. SSRIs made him feel suicidal , gave him strange sensations and that he has been on supple SSRIs and they did not work well for him. When questioned if he could be bipolar he strongly feels after living with his wife who is bipolar he is definitely not bipolar. We will try Wellbutrin XL 300 mg a day and see if  that helps his depression, fatigue and sleep disorder. #10 fatigue. He was wondering if we checked his Lyme titers but explained to him that the titers done before indicate only one positive marker so that he should be cleared of or not experiencing any side effects from the Lyme's disease. I did share with him that his Epstein-Barr virus titers indicated previous infection but that he has recovered from it.   #11 skin lesion he has skin lesion which he wants to have evaluated. Will refer to dermatologist per his request but also explained that the skin lesions appear to be acne like lesions from I think the testosterone injections.  #12 low testosterone level. He is injecting himself with one mL of Depakote testosterone twice a week. This is about twice the level of administration that is recommended. I will not increase at this time but check testosterone levels when he returns in a month.   Review of Systems    BP 147/94  Pulse 113  Ht 5\' 11"  (1.803 m)  Wt 272 lb (123.378 kg)  BMI 37.94 kg/m2  SpO2 94% Objective:   Physical Exam  Constitutional: He is oriented to person, place, and time. He appears well-developed and well-nourished. No distress.       Obese white male  HENT:  Head: Normocephalic.  Neck: Neck supple.  Cardiovascular: Normal rate, regular rhythm and normal heart sounds.  Pulmonary/Chest: Effort normal.  Musculoskeletal: Normal range of motion. Edema: .  Neurological: He is alert and oriented to person, place, and time.  Skin: Skin is warm and dry. Rash noted. He is not diaphoretic.       Acne like rash present  Psychiatric: He has a normal mood and affect. His behavior is normal.      Assessment & Plan:   #1chronic muscle pain. States that the Vicodin caused him to it and is allergic to morphine. We will place him on Percocet 5-25 one tablet twice a day will give him a month's worth. #2 diabetes blood sugars in the morning have come down he is still concerned about  taking the metformin thousand milligrams twice a day. Will consider Victoza if A1c is not better. #3 gout. Will probably need to check a uric acid when he returns. I have recommended he takes the colchicine for one more month. #4 patient was referred for colonoscopy which was finally done. He reports only 3 polyps removed and overall much better than previously. #5 hyperlipidemia. He has informed me that the WelChol was the most expensive of his medication. He is taking it now. Triglycerides reviewed and about 500 now we'll recheck cholesterol in about 4 months. #6 hypothyroidism. We have stopped to his medications the Cytomel and the thyroid Armour and instead have him on Synthroid 0.15 mg a day because the TSH is still slightly elevated we'll repeat this in one month. #7 obesity. He is frustrated on the weight loss so far with phentermine since we have stopped his  H. CG. In reviewing his chart he has lost over 10 pounds in 2 months. Encourage him to continue his phentermine. #8 edema still more than he would like but he is able to get his rings off and on his fingers now. At this point time I will allow him to take 40-80 mg of Lasix daily. #9 depression he reports that disturbance , some swelling feel depressed. He reports that S. SSRIs made him feel suicidal , gave him strange sensations and that he has been on supple SSRIs and they did not work well for him. When questioned if he could be bipolar he strongly feels after living with his wife who is bipolar he is definitely not bipolar. We will try Wellbutrin XL 300 mg a day and see if that helps his depression, fatigue and sleep disorder. #10 fatigue. He was wondering if we checked his Lyme titers but explained to him that the titers done before indicate only one positive marker so that he should be cleared of or not experiencing any side effects from the Lyme's disease. I did share with him that his Epstein-Barr virus titers indicated previous infection  but that he has recovered from it.   #11 skin lesion he has skin lesion which he wants to have evaluated. Will refer to dermatologist per his request but also explained that the skin lesions appear to be acne like lesions from I think the testosterone injections.  #12 low testosterone level. He is injecting himself with one mL of Depakote testosterone twice a week. This is about twice the level of administration that is recommended. I will not increase at this time but check testosterone levels when he returns in a month  At this point time assessment plans were incorporated with a long lengthily discussion that when on during his visit that obviously included counseling and disease management taking up greater than 50% of the extended office time  that was greater then 45 minutes.    Before he returns to see me in one month I recommend 2 days before he has a TSH, A1c if it has been 3 months, testosterone levels, BMP, and uric acid level.

## 2011-12-16 NOTE — Patient Instructions (Signed)
Depression  Depression is a strong emotion of feeling unhappy that can last for weeks, months, or even longer. Depression causes problems with the ability to function in life. It upsets your:   Relationships.   Sleep.   Eating habits.   Work habits.  HOME CARE  Take all medicine as told by your doctor.   Talk with a therapist, counselor, or friend.   Eat a healthy diet.   Exercise regularly.   Do not drink alcohol or use drugs.  GET HELP RIGHT AWAY IF: You start to have thoughts about hurting yourself or others. MAKE SURE YOU:  Understand these instructions.   Will watch your condition.   Will get help right away if you are not doing well or get worse.  Document Released: 06/05/2010 Document Revised: 04/22/2011 Document Reviewed: 06/05/2010 [employer] Patient Information 2012 [employer]. 

## 2011-12-29 ENCOUNTER — Encounter: Payer: Self-pay | Admitting: Family Medicine

## 2012-01-08 ENCOUNTER — Other Ambulatory Visit: Payer: Self-pay | Admitting: Family Medicine

## 2012-01-18 ENCOUNTER — Telehealth: Payer: Self-pay | Admitting: *Deleted

## 2012-01-18 DIAGNOSIS — E291 Testicular hypofunction: Secondary | ICD-10-CM

## 2012-01-18 DIAGNOSIS — E119 Type 2 diabetes mellitus without complications: Secondary | ICD-10-CM

## 2012-01-18 DIAGNOSIS — M109 Gout, unspecified: Secondary | ICD-10-CM

## 2012-01-18 DIAGNOSIS — E039 Hypothyroidism, unspecified: Secondary | ICD-10-CM

## 2012-01-18 NOTE — Telephone Encounter (Signed)
Lab orders entered for Pt

## 2012-01-19 LAB — TESTOSTERONE, FREE, TOTAL, SHBG
Sex Hormone Binding: 8 nmol/L — ABNORMAL LOW (ref 13–71)
Testosterone: 466.17 ng/dL (ref 300–890)

## 2012-01-19 LAB — URIC ACID: Uric Acid, Serum: 7.1 mg/dL (ref 4.0–7.8)

## 2012-01-19 LAB — HEMOGLOBIN A1C: Hgb A1c MFr Bld: 5.3 % (ref <5.7)

## 2012-01-20 ENCOUNTER — Ambulatory Visit (INDEPENDENT_AMBULATORY_CARE_PROVIDER_SITE_OTHER): Admitting: Family Medicine

## 2012-01-20 ENCOUNTER — Encounter: Payer: Self-pay | Admitting: Family Medicine

## 2012-01-20 VITALS — BP 126/87 | HR 93 | Ht 71.0 in | Wt 271.0 lb

## 2012-01-20 DIAGNOSIS — IMO0001 Reserved for inherently not codable concepts without codable children: Secondary | ICD-10-CM

## 2012-01-20 DIAGNOSIS — M109 Gout, unspecified: Secondary | ICD-10-CM

## 2012-01-20 DIAGNOSIS — E119 Type 2 diabetes mellitus without complications: Secondary | ICD-10-CM

## 2012-01-20 DIAGNOSIS — L709 Acne, unspecified: Secondary | ICD-10-CM

## 2012-01-20 DIAGNOSIS — G4700 Insomnia, unspecified: Secondary | ICD-10-CM

## 2012-01-20 DIAGNOSIS — L708 Other acne: Secondary | ICD-10-CM

## 2012-01-20 DIAGNOSIS — E039 Hypothyroidism, unspecified: Secondary | ICD-10-CM

## 2012-01-20 DIAGNOSIS — E669 Obesity, unspecified: Secondary | ICD-10-CM

## 2012-01-20 DIAGNOSIS — M542 Cervicalgia: Secondary | ICD-10-CM

## 2012-01-20 MED ORDER — METAXALONE 800 MG PO TABS: 800.0000 mg | ORAL_TABLET | Freq: Three times a day (TID) | ORAL | Status: DC

## 2012-01-20 MED ORDER — TRAZODONE HCL 150 MG PO TABS: 75.0000 mg | ORAL_TABLET | Freq: Every day | ORAL | Status: DC

## 2012-01-20 MED ORDER — OXYCODONE-ACETAMINOPHEN 5-325 MG PO TABS: 1.0000 | ORAL_TABLET | Freq: Three times a day (TID) | ORAL | Status: DC | PRN

## 2012-01-20 MED ORDER — PHENTERMINE HCL 37.5 MG PO CAPS: 37.5000 mg | ORAL_CAPSULE | ORAL | Status: DC

## 2012-01-20 NOTE — Progress Notes (Addendum)
Subjective:    Patient ID: Jeremiah Long, male    DOB: 27-Sep-1975, 36 y.o.   MRN: [id number]  HPI  #1 acne. Patient has seen a dermatologist for his acne who also agreed that the testosterone injections I the main culprit. Give him some cream to put on acne which seems to help also we'll incursion cut back on the amount efficacy of the testosterone injections. #2 chronic neck and back pain. Patient wants renewal of his oxycodone/acetaminophen for his chronic pain still limiting him to one daily on average we'll give him a limit of 60 a month. #3 gout. He is on Uloric and colchicine. I feel he can stop the colchicine now. #4 muscle spasm. He states that the Norflex hasn't helped muscle spasms he was try something else and Flexeril has been tried before #5 hypothyroidism but was obtained to see what his thyroid hormone levels are at #6 obesity. He wants to continue on his phentermine.   #7 insomnia sleep disorder he is taking Wellbutrin XL 300 mg a day while he doesn't see a lot of positive changes but that he does report that he is having trouble sleeping at night he has used Ambien before and it has not helped.\  #8 diabetes he did get an A1c for evaluation.  Review of Systems  Musculoskeletal: Positive for myalgias, back pain and arthralgias.  Psychiatric/Behavioral: Positive for disturbed wake/sleep cycle.  All other systems reviewed and are negative.      BP 126/87  Pulse 93  Ht 5\' 11"  (1.803 m)  Wt 271 lb (122.925 kg)  BMI 37.80 kg/m2  SpO2 99% Objective:   Physical Exam  Constitutional: He is oriented to person, place, and time. He appears well-developed and well-nourished.  HENT:  Head: Normocephalic.  Neck: Neck supple.  Cardiovascular: Normal rate, regular rhythm and normal heart sounds.   Pulmonary/Chest: Effort normal.  Neurological: He is alert and oriented to person, place, and time.  Skin: Skin is warm and dry.  Psychiatric: He has a normal mood and affect.  His behavior is normal.   Results for orders placed in visit on 01/18/12  URIC ACID      Component Value Range   Uric Acid, Serum 7.1  4.0 - 7.8 mg/dL  TSH      Component Value Range   TSH 2.725  0.350 - 4.500 uIU/mL  TESTOSTERONE, FREE, TOTAL      Component Value Range   Testosterone 466.17  300 - 890 ng/dL   Sex Hormone Binding 8 (*) 13 - 71 nmol/L   Testosterone, Free 163.2  47.0 - 244.0 pg/mL   Testosterone-% Freee. 3.5 (*) 1.6 - 2.9 %  HEMOGLOBIN A1C      Component Value Range   Hemoglobin A1C 5.3  <5.7 %   Mean Plasma Glucose 105  <117 mg/dL      Assessment & Plan:  #1 acne continue to have him use the cream the dermatologist gave him a work to reduce the frequency of his testosterone injections which she states is now only once a week. But we can get him to maybe once a month she noticed testosterone levels finally are within normal range. As he comes off of the estrogen replacement supplement the testosterone should be unopposed.  #2 gout. While I am planning to stop the colchicine I am somewhat concerned about his uric acid going up to 7.1 we'll probably recheck this in 3 months. #3 obesity renew his phentermine 37.5 mg return in  one month followup #4 sleep disturbance. We'll place him on Desyrel a none addicting drug to see if that helps his sleep pattern and may help with some of the depression I think he has  #5 muscle spasm. Since Flexeril and Norflex failed I am going to try him on Skelaxin 800 mg 3 times a day  #6 chronic neck and back pain. Renew his Percocet #60 #7 hypothyroidism. Appears to be under control with his current doses Synthroid will recheck in 3 months #8 diabetes to be under excellent control with his A1c dropping significantly.

## 2012-01-20 NOTE — Patient Instructions (Signed)

## 2012-02-17 ENCOUNTER — Encounter: Payer: Self-pay | Admitting: Family Medicine

## 2012-02-17 ENCOUNTER — Ambulatory Visit (INDEPENDENT_AMBULATORY_CARE_PROVIDER_SITE_OTHER): Admitting: Family Medicine

## 2012-02-17 VITALS — BP 123/72 | HR 99 | Ht 71.0 in | Wt 275.0 lb

## 2012-02-17 DIAGNOSIS — M26609 Unspecified temporomandibular joint disorder, unspecified side: Secondary | ICD-10-CM

## 2012-02-17 DIAGNOSIS — E669 Obesity, unspecified: Secondary | ICD-10-CM

## 2012-02-17 DIAGNOSIS — Z23 Encounter for immunization: Secondary | ICD-10-CM

## 2012-02-17 DIAGNOSIS — M542 Cervicalgia: Secondary | ICD-10-CM

## 2012-02-17 DIAGNOSIS — IMO0001 Reserved for inherently not codable concepts without codable children: Secondary | ICD-10-CM

## 2012-02-17 MED ORDER — ORLISTAT 120 MG PO CAPS: 120.0000 mg | ORAL_CAPSULE | Freq: Three times a day (TID) | ORAL | Status: DC

## 2012-02-17 MED ORDER — PHENTERMINE HCL 37.5 MG PO CAPS: 37.5000 mg | ORAL_CAPSULE | ORAL | Status: DC

## 2012-02-17 MED ORDER — FLUTICASONE PROPIONATE 50 MCG/ACT NA SUSP: 2.0000 | Freq: Every day | NASAL | Status: DC

## 2012-02-17 MED ORDER — OXYCODONE-ACETAMINOPHEN 5-325 MG PO TABS: 1.0000 | ORAL_TABLET | Freq: Three times a day (TID) | ORAL | Status: DC | PRN

## 2012-02-17 NOTE — Patient Instructions (Signed)
Temporomandibular Joint Pain  Your exam shows that you have a problem with your temporomandibular joint (TMJ), the joint that moves when you open your mouth or chew food. TMJ problems can result from direct injuries, bite abnormalities, or tension states which cause you to grind or clench your teeth. Typical symptoms include pain around the joint, clicking, restricted movement, and headaches.  The TMJ is like any other joint in the body; when it is strained, it needs rest to repair itself. To keep the joint at rest it is important that you do not open your mouth wider than the width of your index finger. If you must yawn, be sure to support your chin with your hand so your mouth does not open wide. Eat a soft diet (nothing firmer than ground beef, no raw vegetables), do not chew gum and do not talk if it causes you pain.  Apply topical heat by using a warm, moist cloth placed in front of the ear for 15 to 20 minutes several times daily. Alternating heat and ice may give even more relief. Anti-inflammatory pain medicine and muscle relaxants can also be helpful. A dental orthotic or splint may be used for temporary relief. Long-term problems may require treatment for stress as well as braces or surgery. Please check with your doctor or dentist if your symptoms do not improve within one week.  Document Released: 06/10/2004 Document Revised: 07/26/2011 Document Reviewed: 05/03/2005  [employer]® Patient Information ©2013 [employer].

## 2012-02-17 NOTE — Progress Notes (Signed)
  Subjective:    Patient ID: Jeremiah Long, male    DOB: 1975-08-05, 36 y.o.   MRN: [id number]  HPI Problem #1 jaw pain. He reports having jaw pain and temple area pain. Has been going on for about a week. #2  lead exposure. He is around a lot of ammunition and curious about lead exposure. He is wondering if he could be checked for that as well. #3 obesity. He states he is exercising he thinks he is starting to develop a tolerance to phentermine because his weight has gone up. He did state that he took some extra doses of fluid pill this week and that seemed to have helped. He has tried some over-the-counter supplementation with phentermine but it has not help with his weight gain.  #4 immunization update.   Review of Systems    BP 123/72  Pulse 99  Ht 5\' 11"  (1.803 m)  Wt 275 lb (124.739 kg)  BMI 38.35 kg/m2  SpO2 94% Objective:   Physical Exam  Constitutional: He is oriented to person, place, and time. He appears well-developed and well-nourished.  HENT:  Head: Normocephalic.  Nose: No mucosal edema, rhinorrhea or sinus tenderness. Right sinus exhibits no maxillary sinus tenderness and no frontal sinus tenderness. Left sinus exhibits no maxillary sinus tenderness and no frontal sinus tenderness.       He has some left TMJ tenderness to palpation.  Neck: Normal range of motion. Neck supple. No tracheal deviation present.  Cardiovascular: Normal rate and regular rhythm.   Pulmonary/Chest: Effort normal.  Neurological: He is alert and oriented to person, place, and time. No cranial nerve deficit.  Skin: Skin is warm and dry. No erythema.  Psychiatric: He has a normal mood and affect.      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 Recommend he may need a mouth guard to help reduce inflammation.  #2 history of lead exposure we can check lipid levels and heavy metal screen but I recommend we do that at next blood draw.  #3 immunization needs flu vaccine  given.  #4 history of chronic nasal congestion. Turns out that he's been using Afrin and other over-the-counter nasal sprays. We'll give him a prescription for Flonase 2 puffs each nostril daily and see how that works.  #5 needs refill for chronic pain medicine. His Roxicet 5-25 was renewed.  #6 obesity. Concerned that he's gained some weight. We'll continue to phentermine. Since he has had trouble constipation will also give him a prescription for Xenical. Explained to him he can also get the over-the-counter half-strength if it's more affordable. Recommend he take 1 capsule a day and after one to 2 weeks he can increase it by 1 capsule until is up to 3 capsules if he can tolerate. Some information given about body key from the [employer] Turns out he has been drinking  monster energy drinks and that product is forbidden from now on. I have stressed to him does make sense for him to drink something so loaded with sugar in an effort to lose weight.  Return one month for followup.

## 2012-03-23 ENCOUNTER — Encounter: Payer: Self-pay | Admitting: Family Medicine

## 2012-03-23 ENCOUNTER — Ambulatory Visit (INDEPENDENT_AMBULATORY_CARE_PROVIDER_SITE_OTHER): Admitting: Family Medicine

## 2012-03-23 VITALS — BP 123/81 | HR 82 | Ht 71.0 in | Wt 272.0 lb

## 2012-03-23 DIAGNOSIS — M542 Cervicalgia: Secondary | ICD-10-CM

## 2012-03-23 DIAGNOSIS — Z23 Encounter for immunization: Secondary | ICD-10-CM

## 2012-03-23 MED ORDER — TIZANIDINE HCL 4 MG PO TABS: 4.0000 mg | ORAL_TABLET | Freq: Three times a day (TID) | ORAL | Status: DC

## 2012-03-23 MED ORDER — OXYCODONE-ACETAMINOPHEN 5-325 MG PO TABS: 1.0000 | ORAL_TABLET | Freq: Three times a day (TID) | ORAL | Status: DC | PRN

## 2012-03-23 MED ORDER — PHENTERMINE-TOPIRAMATE ER 7.5-46 MG PO CP24: 1.0000 | ORAL_CAPSULE | Freq: Every day | ORAL | Status: DC

## 2012-03-23 MED ORDER — RABEPRAZOLE SODIUM 20 MG PO TBEC: 20.0000 mg | DELAYED_RELEASE_TABLET | Freq: Every day | ORAL | Status: DC

## 2012-03-23 NOTE — Patient Instructions (Addendum)
Heartburn Heartburn is a painful, burning sensation in the chest. It may feel worse in certain positions, such as lying down or bending over. It is caused by stomach acid backing up into the tube that carries food from the mouth down to the stomach (lower esophagus).  CAUSES   Large meals.  Certain foods and drinks.  Exercise.  Increased acid production.  Being overweight or obese.  Certain medicines. SYMPTOMS   Burning pain in the chest or lower throat.  Bitter taste in the mouth.  Coughing. DIAGNOSIS  If the usual treatments for heartburn do not improve your symptoms, then tests may be done to see if there is another condition present. Possible tests may include:  X-rays.  Endoscopy. This is when a tube with a light and a camera on the end is used to examine the esophagus and the stomach.  A test to measure the amount of acid in the esophagus (pH test).  A test to see if the esophagus is working properly (esophageal manometry).  Blood, breath, or stool tests to check for bacteria that cause ulcers. TREATMENT   Your caregiver may tell you to use certain over-the-counter medicines (antacids, acid reducers) for mild heartburn.  Your caregiver may prescribe medicines to decrease the acid in your stomach or protect your stomach lining.  Your caregiver may recommend certain diet changes.  For severe cases, your caregiver may recommend that the head of your bed be elevated on blocks. (Sleeping with more pillows is not an effective treatment as it only changes the position of your head and does not improve the main problem of stomach acid refluxing into the esophagus.) HOME CARE INSTRUCTIONS   Take all medicines as directed by your caregiver.  Raise the head of your bed by putting blocks under the legs if instructed to by your caregiver.  Do not exercise right after eating.  Avoid eating 2 or 3 hours before bed. Do not lie down right after eating.  Eat small meals  throughout the day instead of 3 large meals.  Stop smoking if you smoke.  Maintain a healthy weight.  Identify foods and beverages that make your symptoms worse and avoid them. Foods you may want to avoid include:  Peppers.  Chocolate.  High-fat foods, including fried foods.  Spicy foods.  Garlic and onions.  Citrus fruits, including oranges, grapefruit, lemons, and limes.  Food containing tomatoes or tomato products.  Mint.  Carbonated drinks, caffeinated drinks, and alcohol.  Vinegar. SEEK IMMEDIATE MEDICAL CARE IF:  You have severe chest pain that goes down your arm or into your jaw or neck.  You feel sweaty, dizzy, or lightheaded.  You are short of breath.  You vomit blood.  You have difficulty or pain with swallowing.  You have bloody or black, tarry stools.  You have episodes of heartburn more than 3 times a week for more than 2 weeks. MAKE SURE YOU:  Understand these instructions.  Will watch your condition.  Will get help right away if you are not doing well or get worse. Document Released: 09/19/2008 Document Revised: 07/26/2011 Document Reviewed: 10/18/2010 [facility] Patient Information 2013 [place], [place]. Gastroesophageal Reflux Disease, Adult Gastroesophageal reflux disease (GERD) happens when acid from your stomach flows up into the esophagus. When acid comes in contact with the esophagus, the acid causes soreness (inflammation) in the esophagus. Over time, GERD may create small holes (ulcers) in the lining of the esophagus. CAUSES   Increased body weight. This puts pressure on the  stomach, making acid rise from the stomach into the esophagus.  Smoking. This increases acid production in the stomach.  Drinking alcohol. This causes decreased pressure in the lower esophageal sphincter (valve or ring of muscle between the esophagus and stomach), allowing acid from the stomach into the esophagus.  Late evening meals and a full stomach. This  increases pressure and acid production in the stomach.  A malformed lower esophageal sphincter. Sometimes, no cause is found. SYMPTOMS   Burning pain in the lower part of the mid-chest behind the breastbone and in the mid-stomach area. This may occur twice a week or more often.  Trouble swallowing.  Sore throat.  Dry cough.  Asthma-like symptoms including chest tightness, shortness of breath, or wheezing. DIAGNOSIS  Your caregiver may be able to diagnose GERD based on your symptoms. In some cases, X-rays and other tests may be done to check for complications or to check the condition of your stomach and esophagus. TREATMENT  Your caregiver may recommend over-the-counter or prescription medicines to help decrease acid production. Ask your caregiver before starting or adding any new medicines.  HOME CARE INSTRUCTIONS   Change the factors that you can control. Ask your caregiver for guidance concerning weight loss, quitting smoking, and alcohol consumption.  Avoid foods and drinks that make your symptoms worse, such as:  Caffeine or alcoholic drinks.  Chocolate.  Peppermint or mint flavorings.  Garlic and onions.  Spicy foods.  Citrus fruits, such as oranges, lemons, or limes.  Tomato-based foods such as sauce, chili, salsa, and pizza.  Fried and fatty foods.  Avoid lying down for the 3 hours prior to your bedtime or prior to taking a nap.  Eat small, frequent meals instead of large meals.  Wear loose-fitting clothing. Do not wear anything tight around your waist that causes pressure on your stomach.  Raise the head of your bed 6 to 8 inches with wood blocks to help you sleep. Extra pillows will not help.  Only take over-the-counter or prescription medicines for pain, discomfort, or fever as directed by your caregiver.  Do not take aspirin, ibuprofen, or other nonsteroidal anti-inflammatory drugs (NSAIDs). SEEK IMMEDIATE MEDICAL CARE IF:   You have pain in your  arms, neck, jaw, teeth, or back.  Your pain increases or changes in intensity or duration.  You develop nausea, vomiting, or sweating (diaphoresis).  You develop shortness of breath, or you faint.  Your vomit is green, yellow, black, or looks like coffee grounds or blood.  Your stool is red, bloody, or black. These symptoms could be signs of other problems, such as heart disease, gastric bleeding, or esophageal bleeding. MAKE SURE YOU:   Understand these instructions.  Will watch your condition.  Will get help right away if you are not doing well or get worse. Document Released: 02/10/2005 Document Revised: 07/26/2011 Document Reviewed: 11/20/2010 [facility] Patient Information 2013 [place], [place]. Hepatitis B Immune Globulin, HBIG injection What is this medicine? HEPATITIS B IMMUNE GLOBULIN (hep uh TAHY tis B im MUNE GLOB yoo lin) is used to prevent infections of hepatitis B after close contact with someone who has the infection. This medicine may be used for other purposes; ask your health care provider or pharmacist if you have questions. What should I tell my health care provider before I take this medicine? They need to know if you have any of these conditions: -bleeding disorder -low levels of immunoglobulin A in the body -low levels of platelets in the blood -an unusual or  allergic reaction to human immune globulin, other medicines, foods, dyes, or preservatives -pregnant or trying to get pregnant -breast-feeding How should I use this medicine? This medicine is for infusion into a muscle. It is given by a health care professional in a hospital or clinic setting. Talk to your pediatrician regarding the use of this medicine in children. While this drug may be prescribed for children as young as newborn for selected conditions, precautions do apply. Overdosage: If you think you have taken too much of this medicine contact a poison control center or emergency room at  once. NOTE: This medicine is only for you. Do not share this medicine with others. What if I miss a dose? This does not apply. What may interact with this medicine? -live virus vaccines This list may not describe all possible interactions. Give your health care provider a list of all the medicines, herbs, non-prescription drugs, or dietary supplements you use. Also tell them if you smoke, drink alcohol, or use illegal drugs. Some items may interact with your medicine. What should I watch for while using this medicine? This medicine is made from human blood. It may be possible to pass an infection in this medicine. Talk to your doctor about the risks and benefits of this medicine. This medicine may interfere with live virus vaccines. Before you get other live virus vaccines tell your health care professional if you have received this medicine within the past 3 months. What side effects may I notice from receiving this medicine? Side effects that you should report to your doctor or health care professional as soon as possible: -allergic reactions like skin rash, itching or hives, swelling of the face, lips, or tongue -breathing problems -chest pain or tightness Side effects that usually do not require medical attention (report to your doctor or health care professional if they continue or are bothersome): -pain and tenderness at site where injected This list may not describe all possible side effects. Call your doctor for medical advice about side effects. You may report side effects to FDA at 1-800-FDA-1088. Where should I keep my medicine? This drug is given in a hospital or clinic and will not be stored at home. NOTE: This sheet is a summary. It may not cover all possible information. If you have questions about this medicine, talk to your doctor, pharmacist, or health care provider.  2012, [employer]. (09/15/2007 3:14:49 PM)

## 2012-03-23 NOTE — Progress Notes (Signed)
  Subjective:    Patient ID: Jeremiah Long, male    DOB: 06/26/1975, 36 y.o.   MRN: [id number]  HPI #1 obesity. He was on OTC Xenical with his phentermine. Also his wife hasn't been keeping him away from the [facility] energy drinks. He states he is wearing his boots still visible at 3-4 pound weight loss with the above changes talk to him and his wife about switching him to  Qsymia.  #2 muscle spasms/neck spasms. He states that the Skelaxin and Mobic are not helping his neck pain and is having a lot of muscle spasms and he would like a different muscle relaxant.  #3 immunization update. He reports getting the flu shot the last time he was here but he states that his told he needs hepatitis B immunization to be be given due to low titers.  #4 chronic pain. He needs renewal of his Roxicet.  #5 reflux. He reports having increased dependence on sleeping on his sideand also reflux that was real bad last night it woke him up and kept him up through the night. The increased snoring and obstruction when he tries to sleep on his back his old but the reflux is negative and getting worse. He does have a history of GERD though and has had endoscopy.  Review of Systems  Constitutional: Negative for activity change and appetite change.  HENT: Positive for neck pain.   Gastrointestinal: Positive for diarrhea.  Musculoskeletal: Positive for myalgias and back pain.       Objective:   Physical Exam  Constitutional: He is oriented to person, place, and time. He appears well-developed and well-nourished.  HENT:  Head: Normocephalic.  Cardiovascular: Regular rhythm.   Neurological: He is alert and oriented to person, place, and time. He has normal reflexes.  Skin: Skin is warm and dry. There is erythema.  Psychiatric: He has a normal mood and affect. His behavior is normal.      Lab Results  Component Value Date   HGBA1C 5.3 01/18/2012      Assessment & Plan:   #1 obesity we'll switch him from  phentermine to Qsymia 7.5/46.  #2Hepatitis B start immunization today.  #3 chronic pain renew his Percocet 5/325  #4 neck spasms/ muscle spasms. Stop Skelaxin will try Zanaflex one tablet 2-3 times a day.  #5 reflux. We'll place him on AcipHex since a work for his wife recommend elevation of the head of his bed as far as possible sleep apnea continue to avoid sleeping on his back.  #6 when he returns in a month to 7 days before his return he needs A1c, uric acid, testosterone level total and free, and  BMP. In 6 months I will repeat the above and a lipid panel.

## 2012-04-03 ENCOUNTER — Ambulatory Visit: Admitting: Sports Medicine

## 2012-04-10 ENCOUNTER — Encounter: Payer: Self-pay | Admitting: Sports Medicine

## 2012-04-10 ENCOUNTER — Ambulatory Visit (INDEPENDENT_AMBULATORY_CARE_PROVIDER_SITE_OTHER): Admitting: Sports Medicine

## 2012-04-10 VITALS — BP 144/90 | HR 100 | Wt 272.0 lb

## 2012-04-10 DIAGNOSIS — M542 Cervicalgia: Secondary | ICD-10-CM

## 2012-04-10 DIAGNOSIS — G8929 Other chronic pain: Secondary | ICD-10-CM

## 2012-04-10 MED ORDER — KETOROLAC TROMETHAMINE 30 MG/ML IJ SOLN
30.0000 mg | Freq: Once | INTRAMUSCULAR | Status: AC
  Administered 2012-04-10: 30 mg via INTRAMUSCULAR

## 2012-04-10 MED ORDER — METHYLPREDNISOLONE SODIUM SUCC 125 MG IJ SOLR
125.0000 mg | Freq: Once | INTRAMUSCULAR | Status: AC
  Administered 2012-04-10: 125 mg via INTRAMUSCULAR

## 2012-04-10 MED ORDER — GABAPENTIN 300 MG PO CAPS: ORAL_CAPSULE | ORAL | Status: DC

## 2012-04-10 NOTE — Progress Notes (Signed)
SPORTS MEDICINE CONSULTATION REPORT  Subjective:    I'm seeing this patient as a consultation for:  Dr. Thurmond Butts  CC: Neck pain, Arm pain  HPI: Jeremiah Long has a long history of neck pain. He's on muscle lactose, and narcotic pain medicine. He also has a history of what sounds to be microdiscectomy for lumbar pain and radiculitis in his back. Unfortunately, over the past few weeks he's noticed pain, numbness, tingling traveling down his right arm to his thumb and his forefinger. He's also noted some weakness on this side.  The pain is worsened with turning his head to the right and extension. Is not really made better by anything.  He has had some physical therapy, oral NSAIDs, and has had some prednisone. The pain is severe.  Past medical history, Surgical history, Family history, Social history, Allergies, and medications have been entered into the medical record, reviewed, and no changes needed.   Review of Systems: No headache, visual changes, nausea, vomiting, diarrhea, constipation, dizziness, abdominal pain, skin rash, fevers, chills, night sweats, weight loss, swollen lymph nodes, body aches, joint swelling, muscle aches, chest pain, or shortness of breath.   Objective:   Vitals:  Afebrile, vital signs stable. General: Well Developed, well nourished, and in no acute distress.  Neuro/Psych: Alert and oriented x3, extra-ocular muscles intact, able to move all 4 extremities.  Skin: Warm and dry, no rashes noted.  Respiratory: Not using accessory muscles, speaking in full sentences, trachea midline.  Cardiovascular: Pulses palpable, no extremity edema. Abdomen: Does not appear distended. Neck: Inspection unremarkable. No palpable stepoffs. Positive Spurling's maneuver, with reproduction of neck pain and radicular symptoms. Full neck range of motion Grip strength and sensation normal in bilateral hands Week in a C5 and C6 distribution on the right side. Hypoesthetic to the right C6  distribution. Negative Hoffman sign bilaterally Reflexes normal  Impression and Recommendations:   This case required medical decision making of moderate complexity.

## 2012-04-10 NOTE — Assessment & Plan Note (Addendum)
Symptoms are suggestive of a right-sided C6 radiculopathy. It sounds as though he's had just about all of the conservative treatment. Toradol 30 mg intramuscular, Solu-Medrol 125 mg intramuscular. C-spine x-rays. We will also set him up for an MRI for interventional injection planning.  I would also like him to try a gabapentin up taper. I will see him back to go over results of the MRI.

## 2012-04-11 ENCOUNTER — Ambulatory Visit (HOSPITAL_BASED_OUTPATIENT_CLINIC_OR_DEPARTMENT_OTHER)
Admission: RE | Admit: 2012-04-11 | Discharge: 2012-04-11 | Disposition: A | Discharge status: Home / Self Care | Source: Ambulatory Visit | Attending: Sports Medicine | Admitting: Sports Medicine

## 2012-04-11 DIAGNOSIS — G8929 Other chronic pain: Secondary | ICD-10-CM

## 2012-04-11 DIAGNOSIS — M542 Cervicalgia: Secondary | ICD-10-CM

## 2012-04-11 DIAGNOSIS — M949 Disorder of cartilage, unspecified: Secondary | ICD-10-CM | POA: Insufficient documentation

## 2012-04-11 DIAGNOSIS — M899 Disorder of bone, unspecified: Secondary | ICD-10-CM | POA: Insufficient documentation

## 2012-04-17 ENCOUNTER — Ambulatory Visit (INDEPENDENT_AMBULATORY_CARE_PROVIDER_SITE_OTHER): Admitting: Sports Medicine

## 2012-04-17 ENCOUNTER — Encounter: Payer: Self-pay | Admitting: Sports Medicine

## 2012-04-17 VITALS — BP 120/81 | HR 84 | Wt 269.0 lb

## 2012-04-17 DIAGNOSIS — M542 Cervicalgia: Secondary | ICD-10-CM

## 2012-04-17 DIAGNOSIS — G8929 Other chronic pain: Secondary | ICD-10-CM

## 2012-04-17 MED ORDER — OXYCODONE-ACETAMINOPHEN 5-325 MG PO TABS: 1.0000 | ORAL_TABLET | Freq: Three times a day (TID) | ORAL | Status: DC | PRN

## 2012-04-17 NOTE — Progress Notes (Signed)
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  He has been through formal physical therapy, oral medicines, but has never had an interventional injection. Symptoms are severe, and he does desire a refill on his narcotic pain medication. Gabapentin has been ineffective thus far. Again, pain is worse with turning his head to the right.  Past medical history, Surgical history, Family history, Social history, Allergies, and medications have been entered into the medical record, reviewed, and no changes needed.   Review of Systems: No headache, visual changes, nausea, vomiting, diarrhea, constipation, dizziness, abdominal pain, skin rash, fevers, chills, night sweats, weight loss, swollen lymph nodes, body aches, joint swelling, muscle aches, chest pain, shortness of breath, mood changes, visual or auditory hallucinations.   Objective:   Vitals:  Afebrile, vital signs stable. General: Well Developed, well nourished, and in no acute distress.  Neuro/Psych: Alert and oriented x3, extra-ocular muscles intact, able to move all 4 extremities.  Skin: Warm and dry, no rashes noted.  Respiratory: Not using accessory muscles, speaking in full sentences, trachea midline.  Cardiovascular: Pulses palpable, no extremity edema. Abdomen: Does not appear distended. Left Shoulder: Inspection reveals no abnormalities, atrophy or asymmetry. Palpation is normal with no tenderness over AC joint or bicipital groove. ROM is limited in internal rotation to lower lumbar. Rotator cuff strength weak to abduction, internal rotation. Resisted internal rotation is very painful with a positive liftoff sign. Positive Hawkins, and if they can sign with weakness, negative Neer sign. Speeds test is positive, Yergason test  negative. No labral pathology noted with negative Obrien's, negative clunk and good stability. Normal scapular function observed. Positive painful arc without a drop arm sign No apprehension sign   We reviewed his MRI together, it shows a broad-based disc protrusion at the C5-C6 level causing bilateral, right worse than left foraminal stenosis likely affecting the exiting C6 nerve root.  Impression and Recommendations:   This case required medical decision making of moderate complexity.

## 2012-04-17 NOTE — Assessment & Plan Note (Addendum)
MRI does confirm a broad-based disc protrusion at the C5-C6 level likely affecting the exiting C6 nerve root on the right side. He has failed conservative therapy, and oral medications. I do think the next step is a transforaminal ideally, but interlaminar if needed injection at the C5-C6 level. I think it would be okay to do this at the C6-C7 level if unable to do as high as C5-C6. We will refer to Dr. Oneal Grout for this. I would like to see him back after the injection to see how he's doing.  I have refilled his Percocet, he understands that this will not be a regular thing with me.

## 2012-04-18 ENCOUNTER — Telehealth: Payer: Self-pay | Admitting: *Deleted

## 2012-04-18 DIAGNOSIS — E039 Hypothyroidism, unspecified: Secondary | ICD-10-CM

## 2012-04-18 DIAGNOSIS — R5383 Other fatigue: Secondary | ICD-10-CM

## 2012-04-18 DIAGNOSIS — R7309 Other abnormal glucose: Secondary | ICD-10-CM

## 2012-04-18 DIAGNOSIS — E291 Testicular hypofunction: Secondary | ICD-10-CM

## 2012-04-18 NOTE — Telephone Encounter (Signed)
Labs ordered.

## 2012-04-20 LAB — COMPLETE METABOLIC PANEL WITH GFR
ALT: 35 U/L (ref 0–53)
Albumin: 4.1 g/dL (ref 3.5–5.2)
Alkaline Phosphatase: 75 U/L (ref 39–117)
CO2: 28 mEq/L (ref 19–32)
GFR, Est Non African American: >89 mL/min
Glucose, Bld: 100 mg/dL — ABNORMAL HIGH (ref 70–99)
Potassium: 4.2 mEq/L (ref 3.5–5.3)
Sodium: 140 mEq/L (ref 135–145)
Total Protein: 6.7 g/dL (ref 6.0–8.3)

## 2012-04-20 LAB — HEMOGLOBIN A1C: Mean Plasma Glucose: 111 mg/dL (ref <117)

## 2012-04-21 LAB — TESTOSTERONE: Testosterone: 999.97 ng/dL — ABNORMAL HIGH (ref 300–890)

## 2012-04-21 LAB — HEAVY METALS, BLOOD: Lead: <2 ug/dL (ref <10)

## 2012-04-21 LAB — ARSENIC, BLOOD: Arsenic, Blood: <3 mcg/L (ref <23)

## 2012-04-24 LAB — TESTOSTERONE, FREE, TOTAL, SHBG: Testosterone-% Free: 3.5 % — ABNORMAL HIGH (ref 1.6–2.9)

## 2012-04-27 ENCOUNTER — Encounter: Payer: Self-pay | Admitting: Family Medicine

## 2012-04-27 ENCOUNTER — Ambulatory Visit (INDEPENDENT_AMBULATORY_CARE_PROVIDER_SITE_OTHER): Admitting: Family Medicine

## 2012-04-27 VITALS — BP 150/89 | HR 96 | Ht 71.0 in | Wt 272.0 lb

## 2012-04-27 DIAGNOSIS — M109 Gout, unspecified: Secondary | ICD-10-CM

## 2012-04-27 DIAGNOSIS — R0989 Other specified symptoms and signs involving the circulatory and respiratory systems: Secondary | ICD-10-CM

## 2012-04-27 DIAGNOSIS — M1A9XX0 Chronic gout, unspecified, without tophus (tophi): Secondary | ICD-10-CM

## 2012-04-27 DIAGNOSIS — R0683 Snoring: Secondary | ICD-10-CM

## 2012-04-27 DIAGNOSIS — R0609 Other forms of dyspnea: Secondary | ICD-10-CM

## 2012-04-27 DIAGNOSIS — E669 Obesity, unspecified: Secondary | ICD-10-CM

## 2012-04-27 DIAGNOSIS — M1A00X0 Idiopathic chronic gout, unspecified site, without tophus (tophi): Secondary | ICD-10-CM

## 2012-04-27 DIAGNOSIS — M542 Cervicalgia: Secondary | ICD-10-CM

## 2012-04-27 MED ORDER — PHENTERMINE HCL 37.5 MG PO CAPS: 37.5000 mg | ORAL_CAPSULE | ORAL | Status: DC

## 2012-04-27 MED ORDER — COLCHICINE 0.6 MG PO TABS: 0.6000 mg | ORAL_TABLET | Freq: Every day | ORAL | Status: DC

## 2012-04-27 MED ORDER — PHENTERMINE-TOPIRAMATE ER 7.5-46 MG PO CP24: 1.0000 | ORAL_CAPSULE | Freq: Every day | ORAL | Status: DC

## 2012-04-27 MED ORDER — FEBUXOSTAT 80 MG PO TABS: 80.0000 mg | ORAL_TABLET | Freq: Every day | ORAL | Status: DC

## 2012-04-27 NOTE — Patient Instructions (Signed)
Gout Gout is an inflammatory condition (arthritis) caused by a buildup of uric acid crystals in the joints. Uric acid is a chemical that is normally present in the blood. Under some circumstances, uric acid can form into crystals in your joints. This causes joint redness, soreness, and swelling (inflammation). Repeat attacks are common. Over time, uric acid crystals can form into masses (tophi) near a joint, causing disfigurement. Gout is treatable and often preventable. CAUSES  The disease begins with elevated levels of uric acid in the blood. Uric acid is produced by your body when it breaks down a naturally found substance called purines. This also happens when you eat certain foods such as meats and fish. Causes of an elevated uric acid level include:  Being passed down from parent to child (heredity).  Diseases that cause increased uric acid production (obesity, psoriasis, some cancers).  Excessive alcohol use.  Diet, especially diets rich in meat and seafood.  Medicines, including certain cancer-fighting drugs (chemotherapy), diuretics, and aspirin.  Chronic kidney disease. The kidneys are no longer able to remove uric acid well.  Problems with metabolism. Conditions strongly associated with gout include:  Obesity.  High blood pressure.  High cholesterol.  Diabetes. Not everyone with elevated uric acid levels gets gout. It is not understood why some people get gout and others do not. Surgery, joint injury, and eating too much of certain foods are some of the factors that can lead to gout. SYMPTOMS   An attack of gout comes on quickly. It causes intense pain with redness, swelling, and warmth in a joint.  Fever can occur.  Often, only one joint is involved. Certain joints are more commonly involved:  Base of the big toe.  Knee.  Ankle.  Wrist.  Finger. Without treatment, an attack usually goes away in a few days to weeks. Between attacks, you usually will not have  symptoms, which is different from many other forms of arthritis. DIAGNOSIS  Your caregiver will suspect gout based on your symptoms and exam. Removal of fluid from the joint (arthrocentesis) is done to check for uric acid crystals. Your caregiver will give you a medicine that numbs the area (local anesthetic) and use a needle to remove joint fluid for exam. Gout is confirmed when uric acid crystals are seen in joint fluid, using a special microscope. Sometimes, blood, urine, and X-ray tests are also used. TREATMENT  There are 2 phases to gout treatment: treating the sudden onset (acute) attack and preventing attacks (prophylaxis). Treatment of an Acute Attack  Medicines are used. These include anti-inflammatory medicines or steroid medicines.  An injection of steroid medicine into the affected joint is sometimes necessary.  The painful joint is rested. Movement can worsen the arthritis.  You may use warm or cold treatments on painful joints, depending which works best for you.  Discuss the use of coffee, vitamin C, or cherries with your caregiver. These may be helpful treatment options. Treatment to Prevent Attacks After the acute attack subsides, your caregiver may advise prophylactic medicine. These medicines either help your kidneys eliminate uric acid from your body or decrease your uric acid production. You may need to stay on these medicines for a very long time. The early phase of treatment with prophylactic medicine can be associated with an increase in acute gout attacks. For this reason, during the first few months of treatment, your caregiver may also advise you to take medicines usually used for acute gout treatment. Be sure you understand your caregiver's directions.   You should also discuss dietary treatment with your caregiver. Certain foods such as meats and fish can increase uric acid levels. Other foods such as dairy can decrease levels. Your caregiver can give you a list of foods  to avoid. HOME CARE INSTRUCTIONS   Do not take aspirin to relieve pain. This raises uric acid levels.  Only take over-the-counter or prescription medicines for pain, discomfort, or fever as directed by your caregiver.  Rest the joint as much as possible. When in bed, keep sheets and blankets off painful areas.  Keep the affected joint raised (elevated).  Use crutches if the painful joint is in your leg.  Drink enough water and fluids to keep your urine clear or pale yellow. This helps your body get rid of uric acid. Do not drink alcoholic beverages. They slow the passage of uric acid.  Follow your caregiver's dietary instructions. Pay careful attention to the amount of protein you eat. Your daily diet should emphasize fruits, vegetables, whole grains, and fat-free or low-fat milk products.  Maintain a healthy body weight. SEEK MEDICAL CARE IF:   You have an oral temperature above 102 F (38.9 C).  You develop diarrhea, vomiting, or any side effects from medicines.  You do not feel better in 24 hours, or you are getting worse. SEEK IMMEDIATE MEDICAL CARE IF:   Your joint becomes suddenly more tender and you have:  Chills.  An oral temperature above 102 F (38.9 C), not controlled by medicine. MAKE SURE YOU:   Understand these instructions.  Will watch your condition.  Will get help right away if you are not doing well or get worse. Document Released: 04/30/2000 Document Revised: 07/26/2011 Document Reviewed: 08/11/2009 [employer] Patient Information 2013 [employer].    

## 2012-04-27 NOTE — Progress Notes (Signed)
Subjective:    Patient ID: Jeremiah Long, male    DOB: [birth year], 36 y.o.   MRN: [id number]  HPI #1 obesity. There was a mixup with him getting the Osymnia.Marland Kitchen He's been off his phentermine for least a week. Because he cannot get the preferred drug Osymnia locally we'll give him phentermine for a month and have her return for a nurse's visit and will give him a prescription for [facility] mail order for 90 days.  #2 gout flareup. Some reason was not getting his colchicine and Uloric.  #3 diabetes followup.   #4 heavy metal screening evaluation. #5 recurrent neck pain #6 testosterone injections every 2 weeks   #7 hypothyroidism. #8 B12 deficiency  Review of Systems  Constitutional: Positive for appetite change and unexpected weight change. Negative for activity change.  All other systems reviewed and are negative.      BP 150/89  Pulse 96  Ht 5\' 11"  (1.803 m)  Wt 272 lb (123.378 kg)  BMI 37.94 kg/m2 Objective:   Physical Exam  Vitals reviewed. Constitutional: He is oriented to person, place, and time. He appears well-developed and well-nourished.  HENT:  Head: Normocephalic.  Cardiovascular: Normal rate and regular rhythm.   Pulmonary/Chest: Effort normal and breath sounds normal.  Musculoskeletal: Normal range of motion.  Neurological: He is alert and oriented to person, place, and time.  Skin: Skin is warm and dry.  Psychiatric: He has a normal mood and affect.      Results for orders placed in visit on 04/18/12  LEAD, BLOOD      Component Value Range   Lead-Whole Blood <1.0  <25.0 ug/dL  HEAVY METALS, BLOOD      Component Value Range   Arsenic <3  <23 mcg/L   Mercury, B <4  <=10 mcg/L   Lead <2  <10 mcg/dL   Collection site Venous    ARSENIC, BLOOD      Component Value Range   Arsenic, Blood <3  <23 mcg/L  COMPLETE METABOLIC PANEL WITH GFR      Component Value Range   Sodium 140  135 - 145 mEq/L   Potassium 4.2  3.5 - 5.3 mEq/L   Chloride 103  96 -  112 mEq/L   CO2 28  19 - 32 mEq/L   Glucose, Bld 100 (*) 70 - 99 mg/dL   BUN 10  6 - 23 mg/dL   Creat 8.29  5.62 - 1.30 mg/dL   Total Bilirubin 1.0  0.3 - 1.2 mg/dL   Alkaline Phosphatase 75  39 - 117 U/L   AST 19  0 - 37 U/L   ALT 35  0 - 53 U/L   Total Protein 6.7  6.0 - 8.3 g/dL   Albumin 4.1  3.5 - 5.2 g/dL   Calcium 9.1  8.4 - 86.5 mg/dL   GFR, Est African American >89     GFR, Est Non African American >89    TSH      Component Value Range   TSH 3.335  0.350 - 4.500 uIU/mL  TESTOSTERONE, FREE, TOTAL      Component Value Range   Sex Hormone Binding 11 (*) 13 - 71 nmol/L   Testosterone, Free 352.0 (*) 47.0 - 244.0 pg/mL   Testosterone-% Freee. 3.5 (*) 1.6 - 2.9 %  HEMOGLOBIN A1C      Component Value Range   Hemoglobin A1C 5.5  <5.7 %   Mean Plasma Glucose 111  <117 mg/dL  TESTOSTERONE  Component Value Range   Testosterone 999.97 (*) 300 - 890 ng/dL      Assessment & Plan:  #1 hypotestosteronism. His testosterone being elevated I think we can reduce his testosterone from once every 2 weeks to once every 3 weeks and out have him recheck his testosterone level in 3-4 months. #2 obesity. He had lost about 4 pounds but he's been off his phentermine for least 2 weeks. We'll need to either put him back on the phentermine or have to have a mail order prescription for the Osymnia a.What I would do is try a prescription for 30 days.we are going to give him 30 days of the phentermine and a 90 day prescription for the Osymnia. Of course return for least a nurse's visit a month to check on weight in and see the doctor in 2 months.  #3 hypothyroidism..Panel looks great.   #4 diabetes. A1c is down to 5.5 his diabetes has come under great control will continue with metformin as prescribed. #5 concern about heavy metal exposure. Heavy metal screen essentially negative.  #6 B12 deficiency. Of course that was not checked we will need to have that checked in 3 months when he rechecks his  testosterone levels and his A1c. Turns out as as he was leaving he had not had his lipids checked either so we will check a lipid profile, uric acid, B12 in the next few days when he can fasting.  #7 gout. He is out of his gout medication. He has had some flareups of pain in his foot. I'm going to place him on colchicine one tablet  a day and also Uloric 80 mg a day. Patient will be warned that during the time he is on Uloric he may have a flareup if he does not take the colchicine daily for 3-6 months.   #8 for chronic neck pain. I suggest he follow up with Dr. Karie Schwalbe. for possible injection of the cervical facet.  Due to providers probably last interaction with this patient visit was greater than 40 minutes this 25 minute discussing patient health and treatment modalities education .O. Weight that he

## 2012-05-11 ENCOUNTER — Other Ambulatory Visit: Payer: Self-pay | Admitting: *Deleted

## 2012-05-11 DIAGNOSIS — R609 Edema, unspecified: Secondary | ICD-10-CM

## 2012-05-11 DIAGNOSIS — E785 Hyperlipidemia, unspecified: Secondary | ICD-10-CM

## 2012-05-11 DIAGNOSIS — E1169 Type 2 diabetes mellitus with other specified complication: Secondary | ICD-10-CM

## 2012-05-11 MED ORDER — COLESEVELAM HCL 625 MG PO TABS: 1875.0000 mg | ORAL_TABLET | Freq: Two times a day (BID) | ORAL | Status: DC

## 2012-05-11 MED ORDER — POTASSIUM CHLORIDE CRYS ER 20 MEQ PO TBCR: 20.0000 meq | EXTENDED_RELEASE_TABLET | Freq: Two times a day (BID) | ORAL | Status: DC

## 2012-05-22 ENCOUNTER — Other Ambulatory Visit: Payer: Self-pay | Admitting: Sports Medicine

## 2012-05-22 DIAGNOSIS — M542 Cervicalgia: Secondary | ICD-10-CM

## 2012-05-23 LAB — LIPID PANEL: HDL: 35 mg/dL — ABNORMAL LOW (ref >39)

## 2012-05-23 LAB — VITAMIN B12: Vitamin B-12: 1332 pg/mL — ABNORMAL HIGH (ref 211–911)

## 2012-05-23 LAB — URIC ACID: Uric Acid, Serum: 5.4 mg/dL (ref 4.0–7.8)

## 2012-05-26 ENCOUNTER — Ambulatory Visit
Admission: RE | Admit: 2012-05-26 | Discharge: 2012-05-26 | Disposition: A | Discharge status: Home / Self Care | Source: Ambulatory Visit | Attending: Sports Medicine | Admitting: Sports Medicine

## 2012-05-26 ENCOUNTER — Ambulatory Visit (INDEPENDENT_AMBULATORY_CARE_PROVIDER_SITE_OTHER): Admitting: Family Medicine

## 2012-05-26 ENCOUNTER — Encounter: Payer: Self-pay | Admitting: Family Medicine

## 2012-05-26 ENCOUNTER — Other Ambulatory Visit: Payer: Self-pay | Admitting: *Deleted

## 2012-05-26 VITALS — BP 116/65 | HR 76 | Wt 275.0 lb

## 2012-05-26 DIAGNOSIS — E785 Hyperlipidemia, unspecified: Secondary | ICD-10-CM

## 2012-05-26 DIAGNOSIS — F329 Major depressive disorder, single episode, unspecified: Secondary | ICD-10-CM

## 2012-05-26 DIAGNOSIS — M542 Cervicalgia: Secondary | ICD-10-CM

## 2012-05-26 DIAGNOSIS — G8929 Other chronic pain: Secondary | ICD-10-CM

## 2012-05-26 DIAGNOSIS — R609 Edema, unspecified: Secondary | ICD-10-CM

## 2012-05-26 DIAGNOSIS — E669 Obesity, unspecified: Secondary | ICD-10-CM

## 2012-05-26 DIAGNOSIS — M1A9XX0 Chronic gout, unspecified, without tophus (tophi): Secondary | ICD-10-CM

## 2012-05-26 DIAGNOSIS — M109 Gout, unspecified: Secondary | ICD-10-CM

## 2012-05-26 DIAGNOSIS — E119 Type 2 diabetes mellitus without complications: Secondary | ICD-10-CM

## 2012-05-26 DIAGNOSIS — F32A Depression, unspecified: Secondary | ICD-10-CM

## 2012-05-26 DIAGNOSIS — E1169 Type 2 diabetes mellitus with other specified complication: Secondary | ICD-10-CM

## 2012-05-26 DIAGNOSIS — M1A00X0 Idiopathic chronic gout, unspecified site, without tophus (tophi): Secondary | ICD-10-CM

## 2012-05-26 DIAGNOSIS — E039 Hypothyroidism, unspecified: Secondary | ICD-10-CM

## 2012-05-26 MED ORDER — OXYCODONE-ACETAMINOPHEN 5-325 MG PO TABS: 1.0000 | ORAL_TABLET | Freq: Three times a day (TID) | ORAL | Status: DC | PRN

## 2012-05-26 MED ORDER — RABEPRAZOLE SODIUM 20 MG PO TBEC: 20.0000 mg | DELAYED_RELEASE_TABLET | Freq: Every day | ORAL | Status: DC

## 2012-05-26 MED ORDER — FEBUXOSTAT 80 MG PO TABS: 80.0000 mg | ORAL_TABLET | Freq: Every day | ORAL | Status: DC

## 2012-05-26 MED ORDER — ORLISTAT 120 MG PO CAPS: 120.0000 mg | ORAL_CAPSULE | Freq: Three times a day (TID) | ORAL | Status: DC

## 2012-05-26 MED ORDER — IOHEXOL 300 MG/ML  SOLN
1.0000 mL | Freq: Once | INTRAMUSCULAR | Status: AC | PRN
  Administered 2012-05-26: 1 mL via EPIDURAL

## 2012-05-26 MED ORDER — BUPROPION HCL ER (XL) 300 MG PO TB24: 300.0000 mg | ORAL_TABLET | ORAL | Status: DC

## 2012-05-26 MED ORDER — PHENTERMINE HCL 37.5 MG PO CAPS: 37.5000 mg | ORAL_CAPSULE | ORAL | Status: DC

## 2012-05-26 MED ORDER — METFORMIN HCL ER (OSM) 1000 MG PO TB24: 2000.0000 mg | ORAL_TABLET | Freq: Every day | ORAL | Status: DC

## 2012-05-26 MED ORDER — TRAZODONE HCL 150 MG PO TABS: 75.0000 mg | ORAL_TABLET | Freq: Every day | ORAL | Status: DC

## 2012-05-26 MED ORDER — FUROSEMIDE 40 MG PO TABS: 40.0000 mg | ORAL_TABLET | Freq: Every day | ORAL | Status: DC | PRN

## 2012-05-26 MED ORDER — TIZANIDINE HCL 4 MG PO TABS: 4.0000 mg | ORAL_TABLET | Freq: Three times a day (TID) | ORAL | Status: DC

## 2012-05-26 MED ORDER — TRIAMCINOLONE ACETONIDE 40 MG/ML IJ SUSP (RADIOLOGY)
60.0000 mg | Freq: Once | INTRAMUSCULAR | Status: AC
  Administered 2012-05-26: 60 mg via EPIDURAL

## 2012-05-26 MED ORDER — LEVOTHYROXINE SODIUM 150 MCG PO TABS: 150.0000 ug | ORAL_TABLET | Freq: Every day | ORAL | Status: DC

## 2012-05-26 MED ORDER — GABAPENTIN 300 MG PO CAPS: ORAL_CAPSULE | ORAL | Status: DC

## 2012-05-26 MED ORDER — COLESEVELAM HCL 625 MG PO TABS: 1875.0000 mg | ORAL_TABLET | Freq: Two times a day (BID) | ORAL | Status: DC

## 2012-05-26 MED ORDER — ATORVASTATIN CALCIUM 80 MG PO TABS: 80.0000 mg | ORAL_TABLET | Freq: Every day | ORAL | Status: DC

## 2012-05-26 MED ORDER — COLCHICINE 0.6 MG PO TABS: 0.6000 mg | ORAL_TABLET | Freq: Every day | ORAL | Status: DC

## 2012-05-26 NOTE — Progress Notes (Signed)
CC: Jeremiah Long is a 37 y.o. male is here for discuss labs and refills   Subjective: HPI:  Patient returns requesting refills on all medications and to go over lab work  Hyperlipidemia: Reports taking WelChol on a daily basis without missed doses prior to a lipid panel earlier this week with triglycerides over 1000. Denies epigastric pain or back pain, GI disturbance, abdominal pain, diarrhea or constipation. Denies nausea nor vomiting.  History of B12 deficiency: Receiving B12 shots every other week. States his energy level has quite improved since starting the shots and has remained satisfactory to his liking.  History of gout: Was started on your Uloric at the last visit and has not had a gout attack since seen last. No new side effects since starting new medication. Denies joint pain, swelling, nor redness. Recent uric acid 5.7  History of depression: Patient describes his mood as great without subjective depression or sadness  Chronic neck pain: He has been managing this with oxycodone symptoms still present daily, he tells me he's getting a cervical steroid injection later today.  Type 2 diabetes: He continues on metformin without GI disturbance. A1c at last visit 5.5. No polyuria polyphasia or polydipsia  History of edema: Sparingly using Lasix, has not needed over the past week. Denies edema proximally nor distally recently. No new shortness of breath  Hypothyroidism: TSH at last visit in December within normal limits. Patient denies fatigue nor nervousness/restlessness/tremor  Obesity: Patient reports inability to keep weight in check despite diet and exercise interventions. He like to go back on phentermine as he reports it aided in weight loss in the past  Review Of Systems Outlined In HPI  No past medical history on file.   No family history on file.   History  Substance Use Topics  . Smoking status: Never Smoker   . Smokeless tobacco: Not on file  . Alcohol  Use: No     Objective: Filed Vitals:   05/26/12 1014  BP: 116/65  Pulse: 76    General: Alert and Oriented, No Acute Distress HEENT: Pupils equal, round, reactive to light. Conjunctivae clear.  Moist mucous membranes, pharynx without inflammation nor lesions.  Neck supple without palpable lymphadenopathy nor abnormal masses. Lungs: Clear to auscultation bilaterally, no wheezing/ronchi/rales.  Comfortable work of breathing. Good air movement. Cardiac: Regular rate and rhythm. Normal S1/S2.  No murmurs, rubs, nor gallops.   Abdomen: Obese soft nontender Extremities: No peripheral edema.  Strong peripheral pulses.  Mental Status: No depression, anxiety, nor agitation. Skin: Warm and dry.  Assessment & Plan: Jeremiah Long was seen today for discuss labs and refills.  Diagnoses and associated orders for this visit:  Chronic gout - Discontinue: Febuxostat (ULORIC) 80 MG TABS; Take 1 tablet (80 mg total) by mouth daily. - Febuxostat (ULORIC) 80 MG TABS; Take 1 tablet (80 mg total) by mouth daily. - colchicine 0.6 MG tablet; Take 1 tablet (0.6 mg total) by mouth daily.  Gout attack - Discontinue: Febuxostat (ULORIC) 80 MG TABS; Take 1 tablet (80 mg total) by mouth daily. - Febuxostat (ULORIC) 80 MG TABS; Take 1 tablet (80 mg total) by mouth daily. - colchicine 0.6 MG tablet; Take 1 tablet (0.6 mg total) by mouth daily.  Chronic neck pain - Discontinue: gabapentin (NEURONTIN) 300 MG capsule; TID.  May increase to a max of 4 tabs PO TID. - Discontinue: gabapentin (NEURONTIN) 300 MG capsule; TID.  May increase to a max of 4 tabs PO TID.  Depression - Discontinue:  buPROPion (WELLBUTRIN XL) 300 MG 24 hr tablet; Take 1 tablet (300 mg total) by mouth every morning. - buPROPion (WELLBUTRIN XL) 300 MG 24 hr tablet; Take 1 tablet (300 mg total) by mouth every morning.  Diabetes mellitus type 2 in obese - Discontinue: metformin (FORTAMET) 1000 MG (OSM) 24 hr tablet; Take 2 tablets (2,000 mg  total) by mouth daily with breakfast. - Discontinue: colesevelam (WELCHOL) 625 MG tablet; Take 3 tablets (1,875 mg total) by mouth 2 (two) times daily with a meal. - Discontinue: colesevelam (WELCHOL) 625 MG tablet; Take 3 tablets (1,875 mg total) by mouth 2 (two) times daily with a meal. - metformin (FORTAMET) 1000 MG (OSM) 24 hr tablet; Take 2 tablets (2,000 mg total) by mouth daily with breakfast.  Hyperlipidemia ldl goal <160 - Discontinue: colesevelam (WELCHOL) 625 MG tablet; Take 3 tablets (1,875 mg total) by mouth 2 (two) times daily with a meal. - Discontinue: colesevelam (WELCHOL) 625 MG tablet; Take 3 tablets (1,875 mg total) by mouth 2 (two) times daily with a meal.  Edema - Discontinue: furosemide (LASIX) 40 MG tablet; Take 1-2 tablets (40-80 mg total) by mouth daily as needed. - furosemide (LASIX) 40 MG tablet; Take 1-2 tablets (40-80 mg total) by mouth daily as needed.  Hypothyroid - levothyroxine (SYNTHROID) 150 MCG tablet; Take 1 tablet (150 mcg total) by mouth daily.  Muscle pain, cervical - oxyCODONE-acetaminophen (ROXICET) 5-325 MG per tablet; Take 1 tablet by mouth every 8 (eight) hours as needed for pain (up to twice a dy).  Obesity (bmi 30-39.9) - phentermine 37.5 MG capsule; Take 1 capsule (37.5 mg total) by mouth every morning.  Other Orders - Discontinue: traZODone (DESYREL) 150 MG tablet; Take 0.5-1 tablets (75-150 mg total) by mouth at bedtime. - Discontinue: tiZANidine (ZANAFLEX) 4 MG tablet; Take 1 tablet (4 mg total) by mouth 3 (three) times daily. - Discontinue: RABEprazole (ACIPHEX) 20 MG tablet; Take 1 tablet (20 mg total) by mouth daily. - Discontinue: orlistat (XENICAL) 120 MG capsule; Take 1 capsule (120 mg total) by mouth 3 (three) times daily with meals. - atorvastatin (LIPITOR) 80 MG tablet; Take 1 tablet (80 mg total) by mouth daily. - atorvastatin (LIPITOR) 80 MG tablet; Take 1 tablet (80 mg total) by mouth daily. - Discontinue: traZODone  (DESYREL) 150 MG tablet; Take 0.5-1 tablets (75-150 mg total) by mouth at bedtime. - Discontinue: tiZANidine (ZANAFLEX) 4 MG tablet; Take 1 tablet (4 mg total) by mouth 3 (three) times daily. - Discontinue: RABEprazole (ACIPHEX) 20 MG tablet; Take 1 tablet (20 mg total) by mouth daily. - Discontinue: orlistat (XENICAL) 120 MG capsule; Take 1 capsule (120 mg total) by mouth 3 (three) times daily with meals.    Chronic gout: Continue uloric and colchicine as patient is a symptomatic and uric acid at goal. Chronic neck pain: Stable, continue oxycodone we'll hope to minimize this if injection today is beneficial Depression: Stable, continue trazodone and Wellbutrin Type 2 diabetes: A1c at goal, clinically stable Hyperlipidemia: Uncontrolled will add statin and consider TriCor if not improved in 3 months Edema: Stable continue when necessary Lasix Hypothyroid: Stable clinically and with evidence of recent TSH continue current levothyroxine regimen Obesity: Will restart phentermine with monthly weight and blood pressure checks 25 minutes spent face-to-face during visit today of which at least 50% was counseling or coordinating care regarding obesity, hypothyroidism, diabetes, hyperlipidemia, neck pain, edema.    Return in about 1 month (around 06/26/2012).

## 2012-05-31 ENCOUNTER — Encounter: Payer: Self-pay | Admitting: Family Medicine

## 2012-05-31 ENCOUNTER — Telehealth: Payer: Self-pay | Admitting: *Deleted

## 2012-05-31 DIAGNOSIS — M542 Cervicalgia: Secondary | ICD-10-CM

## 2012-05-31 MED ORDER — OMEPRAZOLE 40 MG PO CPDR: 40.0000 mg | DELAYED_RELEASE_CAPSULE | Freq: Every day | ORAL | Status: DC

## 2012-05-31 MED ORDER — GABAPENTIN 300 MG PO CAPS: 300.0000 mg | ORAL_CAPSULE | Freq: Three times a day (TID) | ORAL | Status: DC

## 2012-05-31 NOTE — Telephone Encounter (Signed)
[facility] calls and states they need clarification on this patients gabapentin dose and directions. Directions are not clear under the meds. Please advise When calling back use reference # [id number]

## 2012-05-31 NOTE — Telephone Encounter (Signed)
[facility] will not cover aciphex or uloric, sending letter to patient.

## 2012-05-31 NOTE — Telephone Encounter (Signed)
Sue Lush, Rx clarified and placed in your box ready [facility].

## 2012-05-31 NOTE — Telephone Encounter (Signed)
Faxed to [facility] [phone]

## 2012-06-13 ENCOUNTER — Telehealth: Payer: Self-pay | Admitting: Sports Medicine

## 2012-06-13 DIAGNOSIS — M5412 Radiculopathy, cervical region: Secondary | ICD-10-CM

## 2012-06-13 NOTE — Telephone Encounter (Signed)
Pt needs ref for [facility] for injections.  thanks

## 2012-06-13 NOTE — Telephone Encounter (Signed)
Order placed for right-sided C6-C7 interlaminar epidural steroid injection, for right-sided C6 radiculitis, he would need to call [facility] imaging because they do not have a work que with which to see my referral.  He can schedule it that way.

## 2012-06-15 ENCOUNTER — Encounter: Payer: Self-pay | Admitting: Family Medicine

## 2012-06-15 ENCOUNTER — Ambulatory Visit (INDEPENDENT_AMBULATORY_CARE_PROVIDER_SITE_OTHER): Admitting: Family Medicine

## 2012-06-15 VITALS — BP 140/84 | HR 103 | Ht 71.0 in | Wt 263.0 lb

## 2012-06-15 DIAGNOSIS — E291 Testicular hypofunction: Secondary | ICD-10-CM

## 2012-06-15 DIAGNOSIS — R252 Cramp and spasm: Secondary | ICD-10-CM

## 2012-06-15 DIAGNOSIS — IMO0001 Reserved for inherently not codable concepts without codable children: Secondary | ICD-10-CM

## 2012-06-15 DIAGNOSIS — M791 Myalgia, unspecified site: Secondary | ICD-10-CM

## 2012-06-15 MED ORDER — TESTOSTERONE CYPIONATE 200 MG/ML IM SOLN: 200.0000 mg | INTRAMUSCULAR | Status: DC

## 2012-06-15 NOTE — Progress Notes (Signed)
CC: Jeremiah Long is a 37 y.o. male is here for Spasms   Subjective: HPI:  Patient presents with complaints of muscle cramps. This is been present for a month. It occurs on almost daily basis. Described as moderate to severe in severity. Affecting the right biceps, left trapezius, bilateral hamstrings. Symptoms come on abruptly without warning, they are improved with massage and stretching but only minimally.  Symptoms can occur any hour of the day. No changes in medication or diet recently.  He is been taking one to 2 tablets of Lasix on a daily basis depending on the degree of his lower extremity edema, he takes potassium with this. He has a past medical history of hypokalemia per his report.  He would like a refill on syringes for testosterone. He has a history of hypogonadism. He reports improving but still low libido. He reports sluggishness during the day without complaints of erectile dysfunction. Before Dr. Thurmond Butts left his regimen was switched to 200 mg testosterone IM every 2 weeks.   Review Of Systems Outlined In HPI  Past Medical History  Diagnosis Date  . Chronic gout 11/02/2011  . Depression 12/16/2011  . Diabetes mellitus type 2 in obese 11/02/2011  . Edema 12/16/2011  . Hx of adenomatous colonic polyps 11/02/2011  . Hyperlipidemia LDL goal <160 11/02/2011  . Hypogonadism male 11/02/2011  . Hypothyroidism 11/02/2011  . Obesity, Class II, BMI 35-39.9, with comorbidity 12/16/2011  . Right C6 radiculopathy 12/16/2011     History reviewed. No pertinent family history.   History  Substance Use Topics  . Smoking status: Never Smoker   . Smokeless tobacco: Not on file  . Alcohol Use: No     Objective: Filed Vitals:   06/15/12 1113  BP: 140/84  Pulse: 103    General: Alert and Oriented, No Acute Distress HEENT: Pupils equal, round, reactive to light. Conjunctivae clear. Moist mucous membranes  Lungs: Clear to auscultation bilaterally, no wheezing/ronchi/rales.  Comfortable  work of breathing. Good air movement. Cardiac: Regular rate and rhythm. Normal S1/S2.  No murmurs, rubs, nor gallops.   Abdomen: Normal bowel sounds, soft and non tender without palpable masses. Extremities: No peripheral edema.  Strong peripheral pulses. Full strength and range of motion in all 4 extremities, C5 and L4 DTRs two over four bilaterally. Pain is reproduced with palpation of the right biceps and left trapezius. No current signs of hypertonicity of any of the affected muscle groups. Mental Status: No depression, anxiety, nor agitation. Skin: Warm and dry.  Assessment & Plan: Jeremiah Long was seen today for spasms.  Diagnoses and associated orders for this visit:  Myalgia - COMPLETE METABOLIC PANEL WITH GFR - CK (Creatine Kinase)  Muscle cramps - COMPLETE METABOLIC PANEL WITH GFR - CK (Creatine Kinase)  Hypogonadism male - testosterone cypionate (DEPOTESTOTERONE CYPIONATE) 200 MG/ML injection; Inject 1 mL (200 mg total) into the muscle every 14 (fourteen) days.    myalgias and muscle cramps: He has not started taking atorvastatin and confirms his not taking any other statin. I like him to hold off on taking atorvastatin until we find the source of his pain. We will rule out electrolyte abnormality and rule out rhabdomyolysis. Hypogonadism: Improving, I've asked him to call our office with the needle gauge he is currently using and will provide him with 3 mL syringes with needles  25 minutes spent face-to-face during visit today of which at least 50% was counseling or coordinating care regarding myalgia, muscle cramps, hypogonadism.  Return if symptoms  worsen or fail to improve.

## 2012-06-16 ENCOUNTER — Telehealth: Payer: Self-pay | Admitting: Family Medicine

## 2012-06-16 DIAGNOSIS — R252 Cramp and spasm: Secondary | ICD-10-CM

## 2012-06-16 LAB — COMPLETE METABOLIC PANEL WITH GFR
ALT: 46 U/L (ref 0–53)
Albumin: 4.7 g/dL (ref 3.5–5.2)
CO2: 29 mEq/L (ref 19–32)
Calcium: 9.9 mg/dL (ref 8.4–10.5)
Chloride: 99 mEq/L (ref 96–112)
GFR, Est African American: >89 mL/min
GFR, Est Non African American: 88 mL/min
Glucose, Bld: 96 mg/dL (ref 70–99)
Sodium: 139 mEq/L (ref 135–145)
Total Bilirubin: 1.1 mg/dL (ref 0.3–1.2)
Total Protein: 8 g/dL (ref 6.0–8.3)

## 2012-06-16 NOTE — Telephone Encounter (Signed)
Sue Lush, 1.) Can you see if [facility] can add on a magnesium level to the bloodwork from yesterday.  Lab order placed in your box. 2.) Will you please let Mr. Goates know that his potassium and salt levels were normal and not likely causing his cramping.  I'm going to see if the lab can run some more tests on the blood from yesterday to look for the source of the cramping. Thank you.

## 2012-06-16 NOTE — Telephone Encounter (Signed)
New labs have been added-spoke with Tresa Endo at [facility], magnesium level was received in

## 2012-06-16 NOTE — Telephone Encounter (Signed)
Pt notified of lab work.

## 2012-06-20 ENCOUNTER — Telehealth: Payer: Self-pay | Admitting: Family Medicine

## 2012-06-20 DIAGNOSIS — R252 Cramp and spasm: Secondary | ICD-10-CM

## 2012-06-20 NOTE — Telephone Encounter (Signed)
Sue Lush, Will you please let Jeremiah Long know that his electrolytes were all within normal limits and I couldn't find any abnormalities to explain his muscle spasms.  The most common cause of muscle spasms are dehydration or medication side effects but I can't see any meds that would be causing this.  We could try separate muscle relaxers such as Norflex, Skelaxin, or Flexeril, I can't recall if he's used any of these in the past.

## 2012-06-21 MED ORDER — ORPHENADRINE CITRATE ER 100 MG PO TB12: 100.0000 mg | ORAL_TABLET | Freq: Two times a day (BID) | ORAL | Status: DC | PRN

## 2012-06-21 NOTE — Telephone Encounter (Signed)
rx faxed to [facility] [place]

## 2012-06-21 NOTE — Telephone Encounter (Signed)
Sue Lush, Norflex placed in your box ready for [facility]. Thanks

## 2012-06-21 NOTE — Telephone Encounter (Signed)
Pt notified and thinks he may have tried flexeril in the past and it didn't work for him. He is willing to try any of the other two meds

## 2012-06-23 ENCOUNTER — Encounter: Payer: Self-pay | Admitting: Family Medicine

## 2012-06-23 ENCOUNTER — Ambulatory Visit (INDEPENDENT_AMBULATORY_CARE_PROVIDER_SITE_OTHER): Admitting: Family Medicine

## 2012-06-23 ENCOUNTER — Ambulatory Visit
Admission: RE | Admit: 2012-06-23 | Discharge: 2012-06-23 | Disposition: A | Discharge status: Home / Self Care | Source: Ambulatory Visit | Attending: Sports Medicine | Admitting: Sports Medicine

## 2012-06-23 VITALS — BP 123/72 | HR 86 | Wt 259.0 lb

## 2012-06-23 VITALS — BP 141/86 | HR 77

## 2012-06-23 DIAGNOSIS — E669 Obesity, unspecified: Secondary | ICD-10-CM

## 2012-06-23 DIAGNOSIS — G8929 Other chronic pain: Secondary | ICD-10-CM

## 2012-06-23 DIAGNOSIS — M549 Dorsalgia, unspecified: Secondary | ICD-10-CM

## 2012-06-23 DIAGNOSIS — M5412 Radiculopathy, cervical region: Secondary | ICD-10-CM

## 2012-06-23 MED ORDER — TRIAMCINOLONE ACETONIDE 40 MG/ML IJ SUSP (RADIOLOGY): 60.0000 mg | Freq: Once | INTRAMUSCULAR | Status: DC

## 2012-06-23 MED ORDER — IOHEXOL 300 MG/ML  SOLN: 1.0000 mL | Freq: Once | INTRAMUSCULAR | Status: AC | PRN

## 2012-06-23 MED ORDER — PHENTERMINE HCL 37.5 MG PO CAPS: 37.5000 mg | ORAL_CAPSULE | ORAL | Status: DC

## 2012-06-23 MED ORDER — OXYCODONE-ACETAMINOPHEN 7.5-325 MG PO TABS: 1.0000 | ORAL_TABLET | Freq: Two times a day (BID) | ORAL | Status: DC | PRN

## 2012-06-23 NOTE — Progress Notes (Signed)
CC: Jeremiah Long is a 37 y.o. male is here for f/u weight loss and f/u muscle spasms   Subjective: HPI:  Followup chronic back pain: He has noticed some improvement in his cervical back pain with recent steroid injections. He's starting to get feeling in his right hand back now. No other new motor or sensory disturbances. He has been taking half a tablet of the 5 mg oxycodone in the morning and one half tablets in the evening, pain seems to be well-controlled the evening but not so much in the morning. Quality of life is satisfactory on this regimen. He denies any deterioration in the character or severity of his back pain. He denies saddle paresthesia, outer bladder incontinence, this of the legs, nor any other motor or sensory disturbances.  Followup obesity: He is been on phentermine for about a month now and has noticed weight loss at home. He is improving with cutting down on portions of meals. He is satisfied with his progress.  Denies chest pain, irregular heartbeat, paranoia. He does report daytime sleepiness but is in the process of having a sleep study interpreted.   Review Of Systems Outlined In HPI  Past Medical History  Diagnosis Date  . Chronic gout 11/02/2011  . Depression 12/16/2011  . Diabetes mellitus type 2 in obese 11/02/2011  . Edema 12/16/2011  . Hx of adenomatous colonic polyps 11/02/2011  . Hyperlipidemia LDL goal <160 11/02/2011  . Hypogonadism male 11/02/2011  . Hypothyroidism 11/02/2011  . Obesity, Class II, BMI 35-39.9, with comorbidity 12/16/2011  . Right C6 radiculopathy 12/16/2011     No family history on file.   History  Substance Use Topics  . Smoking status: Never Smoker   . Smokeless tobacco: Not on file  . Alcohol Use: No     Objective: Filed Vitals:   06/23/12 0948  BP: 123/72  Pulse: 86    General: Alert and Oriented, No Acute Distress HEENT: Pupils equal, round, reactive to light. Conjunctivae clear.  Moist mucous membranes  Lungs: Clear  to auscultation bilaterally, no wheezing/ronchi/rales.  Comfortable work of breathing. Good air movement. Cardiac: Regular rate and rhythm. Normal S1/S2.  No murmurs, rubs, nor gallops.   Abdomen: Obese and soft. Extremities: No peripheral edema.  Strong peripheral pulses.  Mental Status: No depression, anxiety, nor agitation. Skin: Warm and dry.  Assessment & Plan: [name] was seen today for f/u weight loss and f/u muscle spasms.  Diagnoses and associated orders for this visit:  Obesity (bmi 30-39.9) - phentermine 37.5 MG capsule; Take 1 capsule (37.5 mg total) by mouth every morning.  Chronic back pain - oxyCODONE-acetaminophen (PERCOCET) 7.5-325 MG per tablet; Take 1 tablet by mouth 2 (two) times daily as needed for pain.    Obesity: Improving, continue phentermine as he is losing weight at a safe velocity Chronic back pain: Stable, continue oxycodone acetaminophen regimen provided with 7.5 mg tablets to aid with dosing in hopes of providing him relief and comfort throughout the day   Return in about 2 months (around 08/21/2012).

## 2012-07-21 ENCOUNTER — Ambulatory Visit: Admitting: Family Medicine

## 2012-07-25 ENCOUNTER — Ambulatory Visit (INDEPENDENT_AMBULATORY_CARE_PROVIDER_SITE_OTHER): Admitting: Family Medicine

## 2012-07-25 ENCOUNTER — Encounter: Payer: Self-pay | Admitting: Family Medicine

## 2012-07-25 VITALS — BP 104/64 | HR 88 | Wt 266.0 lb

## 2012-07-25 DIAGNOSIS — Z79899 Other long term (current) drug therapy: Secondary | ICD-10-CM

## 2012-07-25 DIAGNOSIS — R42 Dizziness and giddiness: Secondary | ICD-10-CM

## 2012-07-25 DIAGNOSIS — E669 Obesity, unspecified: Secondary | ICD-10-CM

## 2012-07-25 DIAGNOSIS — E538 Deficiency of other specified B group vitamins: Secondary | ICD-10-CM

## 2012-07-25 DIAGNOSIS — G8929 Other chronic pain: Secondary | ICD-10-CM

## 2012-07-25 DIAGNOSIS — H547 Unspecified visual loss: Secondary | ICD-10-CM

## 2012-07-25 DIAGNOSIS — E291 Testicular hypofunction: Secondary | ICD-10-CM

## 2012-07-25 DIAGNOSIS — G4700 Insomnia, unspecified: Secondary | ICD-10-CM

## 2012-07-25 DIAGNOSIS — E119 Type 2 diabetes mellitus without complications: Secondary | ICD-10-CM

## 2012-07-25 DIAGNOSIS — L700 Acne vulgaris: Secondary | ICD-10-CM

## 2012-07-25 MED ORDER — PHENTERMINE HCL 37.5 MG PO CAPS: 37.5000 mg | ORAL_CAPSULE | ORAL | Status: DC

## 2012-07-25 MED ORDER — OXYCODONE-ACETAMINOPHEN 7.5-325 MG PO TABS: 1.0000 | ORAL_TABLET | Freq: Two times a day (BID) | ORAL | Status: DC | PRN

## 2012-07-25 MED ORDER — TRETINOIN 0.05 % EX CREA: TOPICAL_CREAM | Freq: Every day | CUTANEOUS | Status: DC

## 2012-07-25 NOTE — Progress Notes (Signed)
CC: Jeremiah Long is a 37 y.o. male is here for f/u meds   Subjective: HPI:  Followup type 2 diabetes: Patient continues on metformin on a daily basis, denies polyuria polyphasia or polydipsia. Denies poorly healing wounds nor motor or sensory disturbances nor numbing or tingling in extremities. Denies foot wounds or lesions. Followup hypogonadism: Patient has been receiving testosterone cryoprecipitate 200 mg every 2-3 weeks at home. He reports improvement in irritability but has not seen much improvement with erectile dysfunction or libido nor energy level. Followup insomnia: Patient has stopped taking trazodone on a nightly basis for recent is unsure of. Recent sleep study rule out sleep apnea. He reports return of trouble staying asleep, often waking in the early morning hours for no particular reason. Denies napping during the day, forgetfulness, fatigue, nor nonrestorative sleep. Patient complains of dizziness that has been occurring for the past week. Is described as mild/moderate in severity. It occurs whenever he transitions from a dependent to standing position. It last for matter of seconds and improves if he holds this position a vertical standpoint. Nothing else makes better or worse. He often gets some transient loss of vision when this occurs Patient complains of red bumps occurring on the back of his neck and forehead. It seems to get worse the more sweaty he is. It improves if he keeps his face and neck clean and dry. He is trying over-the-counter acne medication without any improvement Followup obesity: Patient reports forgetting to take phentermine for one and a half weeks approximately 2 weeks ago. Recently restarted over the weekend. He reports as long as he is taking it he has great response with portion control at all meals. Followup chronic back pain: Patient denies any changes or decline in the severity or frequency of his back pain. Denies saddle paresthesia, bowel  incontinence, bladder incontinence, nor weakness in the legs. Followup B12 deficiency: Patient has spaced out his B12 injections to every 2 weeks, he wonders if this still provide adequate B12 supplementation    Review Of Systems Outlined In HPI  Past Medical History  Diagnosis Date  . Chronic gout 11/02/2011  . Depression 12/16/2011  . Diabetes mellitus type 2 in obese 11/02/2011  . Edema 12/16/2011  . Hx of adenomatous colonic polyps 11/02/2011  . Hyperlipidemia LDL goal <160 11/02/2011  . Hypogonadism male 11/02/2011  . Hypothyroidism 11/02/2011  . Obesity, Class II, BMI 35-39.9, with comorbidity 12/16/2011  . Right C6 radiculopathy 12/16/2011     No family history on file.   History  Substance Use Topics  . Smoking status: Never Smoker   . Smokeless tobacco: Not on file  . Alcohol Use: No     Objective: Filed Vitals:   07/25/12 1359  BP: 104/64  Pulse: 88    Vital signs reviewed. General: Alert and Oriented, No Acute Distress HEENT: Pupils equal, round, reactive to light. Conjunctivae clear.  External ears unremarkable.  Moist mucous membranes. Lungs: Clear and comfortable work of breathing, speaking in full sentences without accessory muscle use. Cardiac: Regular rate and rhythm. No murmurs rubs or gallops Neuro: CN II-XII grossly intact, gait normal. Extremities: No peripheral edema.  Strong peripheral pulses.  Mental Status: No depression, anxiety, nor agitation. Logical though process. Skin: Warm and dry. Closed comedones on the nape of the neck and forehead  Assessment & Plan: Chesky was seen today for f/u meds.  Diagnoses and associated orders for this visit:  Diabetes mellitus type 2 in obese - HgB A1c -  Microalbumin / creatinine urine ratio  Hypogonadism male - CBC - PSA - Testosterone, free, total  High risk medication use - CBC - PSA  Insomnia  Dizziness  Vision loss - HgB A1c - Ambulatory referral to Ophthalmology  Acne  vulgaris - tretinoin (RETIN-A) 0.05 % cream; Apply topically at bedtime.  Obesity (BMI 30-39.9) - phentermine 37.5 MG capsule; Take 1 capsule (37.5 mg total) by mouth every morning.  Chronic back pain - oxyCODONE-acetaminophen (PERCOCET) 7.5-325 MG per tablet; Take 1 tablet by mouth 2 (two) times daily as needed for pain.  B12 deficiency - B12    Thank you diabetes: Clinically controlled, continue metformin however due for A1c and urine micro-albumen Hypogonadism with testosterone supplementation: Patient is due for hemoglobin and PSA check, additionally will have testosterone levels checked approximately one week from now which will be midpoint between Q2 week injections Insomnia: Uncontrolled, restart trazodone Dizziness: Discussed with patient orthostatic like symptoms and taking care and taking time when changing vertical positions, Vision loss: Will check current diabetes status with A1c, patient requires referral to ophthalmology Acne vulgaris: Uncontrolled, start Retin-A Obesity: Improving, continue phentermine and stress importance of taking on a daily gases Chronic back pain: Continue Percocet regimen, stable B12 deficiency: Will obtain B12 level  45 minutes spent face-to-face during visit today of which at least 50% was counseling or coordinating care regarding hypogonadism: Testosterone supplementation: B12 deficiency, chronic back pain, obesity, type 2 diabetes, vision loss, dizziness, insomnia.   Return in about 4 weeks (around 08/22/2012).

## 2012-07-28 LAB — HEMOGLOBIN A1C
Hgb A1c MFr Bld: 5.5 % (ref <5.7)
Mean Plasma Glucose: 111 mg/dL (ref <117)

## 2012-07-28 LAB — CBC
HCT: 43.4 % (ref 39.0–52.0)
MCH: 31.9 pg (ref 26.0–34.0)
MCHC: 36.4 g/dL — ABNORMAL HIGH (ref 30.0–36.0)
MCV: 87.7 fL (ref 78.0–100.0)
Platelets: 160 10*3/uL (ref 150–400)
RDW: 14.2 % (ref 11.5–15.5)

## 2012-07-28 LAB — MICROALBUMIN / CREATININE URINE RATIO
Microalb Creat Ratio: 5.2 mg/g (ref 0.0–30.0)
Microalb, Ur: 1.38 mg/dL (ref 0.00–1.89)

## 2012-07-28 LAB — VITAMIN B12: Vitamin B-12: 1300 pg/mL — ABNORMAL HIGH (ref 211–911)

## 2012-07-31 ENCOUNTER — Telehealth: Payer: Self-pay | Admitting: Family Medicine

## 2012-07-31 DIAGNOSIS — E291 Testicular hypofunction: Secondary | ICD-10-CM

## 2012-07-31 LAB — TESTOSTERONE, FREE, TOTAL, SHBG: Sex Hormone Binding: 15 nmol/L (ref 13–71)

## 2012-07-31 NOTE — Telephone Encounter (Signed)
LMOM to return call for results. Destinee Taber, LPN  

## 2012-07-31 NOTE — Telephone Encounter (Signed)
Patient advised of results.

## 2012-07-31 NOTE — Telephone Encounter (Signed)
Jeremiah Long, Will you please let Jeremiah Long know that his testosterone levels are well above the upper limit of normal.  His current injection regimen is too aggressive and I would encourage him to space out injections to every three weeks instead of every two weeks.  His A1c is 5.5 reflecting great blood sugar control.  There was no sign of kidney damage with his urine studies.  There was no sign of Vitamin B12 deficiency.  No need to change any other medications at this time.

## 2012-08-08 ENCOUNTER — Ambulatory Visit (INDEPENDENT_AMBULATORY_CARE_PROVIDER_SITE_OTHER): Admitting: Family Medicine

## 2012-08-08 ENCOUNTER — Encounter: Payer: Self-pay | Admitting: Family Medicine

## 2012-08-08 VITALS — BP 108/67 | HR 77 | Wt 266.0 lb

## 2012-08-08 DIAGNOSIS — I781 Nevus, non-neoplastic: Secondary | ICD-10-CM

## 2012-08-08 DIAGNOSIS — M25559 Pain in unspecified hip: Secondary | ICD-10-CM

## 2012-08-08 DIAGNOSIS — I951 Orthostatic hypotension: Secondary | ICD-10-CM

## 2012-08-08 DIAGNOSIS — M25551 Pain in right hip: Secondary | ICD-10-CM

## 2012-08-08 NOTE — Progress Notes (Signed)
CC: Jeremiah Long is a 37 y.o. male is here for lesions on the penis   Subjective: HPI:  Patient complains of dark spots on his scrotum. He does not know how long they have been there. They're painless. They do not itch. Over the past week they've not gotten bigger or smaller. He doesn't believe that this before. He tried to take one off and it caused bleeding for 15-30 minutes. No interventions otherwise. No personal or family history of skin cancer. He denies dysuria, testicular pain, penile discharge, nor testicular swelling or scrotal swelling  Patient complains of continued right hip pain. It has been present for many months now. It is worse with standing for long periods of time, sexual intercourse, or extreme hip flexion. He has tried stretching exercises at home without improvement. It improves with rest. Is described as a cramping within the hip joint. He denies weakness, falls, groin pain, recent or remote trauma. He denies skin changes at the site of his discomfort. He localizes the pain making a c on his outer hip.  Patient complains of dizziness upon standing quickly. This is been present since I saw him last. It only occurs if he goes from squatting to standing position quickly. It lasts a matter of seconds and improves with rest. He denies chest pain, shortness of breath, palpitations, regular heartbeat, motor or sensory disturbances    Review Of Systems Outlined In HPI  Past Medical History  Diagnosis Date  . Chronic gout 11/02/2011  . Depression 12/16/2011  . Diabetes mellitus type 2 in obese 11/02/2011  . Edema 12/16/2011  . Hx of adenomatous colonic polyps 11/02/2011  . Hyperlipidemia LDL goal <160 11/02/2011  . Hypogonadism male 11/02/2011  . Hypothyroidism 11/02/2011  . Obesity, Class II, BMI 35-39.9, with comorbidity 12/16/2011  . Right C6 radiculopathy 12/16/2011     History reviewed. No pertinent family history.   History  Substance Use Topics  . Smoking status:  Never Smoker   . Smokeless tobacco: Not on file  . Alcohol Use: No     Objective: Filed Vitals:   08/08/12 1412  BP: 108/67  Pulse: 77    General: Alert and Oriented, No Acute Distress HEENT: Pupils equal, round, reactive to light. Conjunctivae clear.  Moist mucous membranes, pharynx without inflammation nor lesions.  Neck supple without palpable lymphadenopathy nor abnormal masses. Lungs: Clear to auscultation bilaterally, no wheezing/ronchi/rales.  Comfortable work of breathing. Good air movement. Cardiac: Regular rate and rhythm. Normal S1/S2.  No murmurs, rubs, nor gallops.   Extremities: No peripheral edema.  Strong peripheral pulses. RLE: Pain reproduced with full passive hip flexion, negative FADIR, pain reproduced with FABER, negative log roll, negative straight leg raise, pain slightly reproduced with palpation of greater trochanter Mental Status: No depression, anxiety, nor agitation. Skin: Warm and dry. 3 Cherry angioma on the scrotum  Assessment & Plan: Octavis was seen today for lesions on the penis.  Diagnoses and associated orders for this visit:  Right hip pain - DG Hip Complete Right; Future  Cherry angioma  Orthostatic hypotension    Right hip pain: Suspect osteoarthritis versus sartorius strain/groin strain. Obtaining films Cherry angioma on the scrotum: Discussed benign nature of these lesions, trying to avoid irritation or abrasion that would cause bleeding  orthostatic hypotension: Encouraged patient to slowly change vertical positions and may consider slightly increasing salt intake    Return in about 4 weeks (around 09/05/2012).

## 2012-08-09 ENCOUNTER — Ambulatory Visit (HOSPITAL_BASED_OUTPATIENT_CLINIC_OR_DEPARTMENT_OTHER)
Admission: RE | Admit: 2012-08-09 | Discharge: 2012-08-09 | Disposition: A | Discharge status: Home / Self Care | Source: Ambulatory Visit | Attending: Family Medicine | Admitting: Family Medicine

## 2012-08-09 DIAGNOSIS — M1610 Unilateral primary osteoarthritis, unspecified hip: Secondary | ICD-10-CM | POA: Insufficient documentation

## 2012-08-09 DIAGNOSIS — M25551 Pain in right hip: Secondary | ICD-10-CM

## 2012-08-09 DIAGNOSIS — M169 Osteoarthritis of hip, unspecified: Secondary | ICD-10-CM | POA: Insufficient documentation

## 2012-08-10 ENCOUNTER — Telehealth: Payer: Self-pay | Admitting: Family Medicine

## 2012-08-10 ENCOUNTER — Encounter: Payer: Self-pay | Admitting: Family Medicine

## 2012-08-10 DIAGNOSIS — M1611 Unilateral primary osteoarthritis, right hip: Secondary | ICD-10-CM

## 2012-08-10 NOTE — Telephone Encounter (Signed)
Per request

## 2012-08-21 ENCOUNTER — Other Ambulatory Visit: Payer: Self-pay | Admitting: *Deleted

## 2012-08-21 DIAGNOSIS — E039 Hypothyroidism, unspecified: Secondary | ICD-10-CM

## 2012-08-21 MED ORDER — LEVOTHYROXINE SODIUM 150 MCG PO TABS: 150.0000 ug | ORAL_TABLET | Freq: Every day | ORAL | Status: DC

## 2012-08-25 ENCOUNTER — Ambulatory Visit (INDEPENDENT_AMBULATORY_CARE_PROVIDER_SITE_OTHER): Admitting: Family Medicine

## 2012-08-25 ENCOUNTER — Encounter: Payer: Self-pay | Admitting: Family Medicine

## 2012-08-25 DIAGNOSIS — J302 Other seasonal allergic rhinitis: Secondary | ICD-10-CM

## 2012-08-25 DIAGNOSIS — E669 Obesity, unspecified: Secondary | ICD-10-CM

## 2012-08-25 DIAGNOSIS — J309 Allergic rhinitis, unspecified: Secondary | ICD-10-CM

## 2012-08-25 DIAGNOSIS — J45909 Unspecified asthma, uncomplicated: Secondary | ICD-10-CM

## 2012-08-25 DIAGNOSIS — M549 Dorsalgia, unspecified: Secondary | ICD-10-CM

## 2012-08-25 DIAGNOSIS — M1A00X0 Idiopathic chronic gout, unspecified site, without tophus (tophi): Secondary | ICD-10-CM

## 2012-08-25 DIAGNOSIS — M109 Gout, unspecified: Secondary | ICD-10-CM

## 2012-08-25 DIAGNOSIS — G8929 Other chronic pain: Secondary | ICD-10-CM

## 2012-08-25 MED ORDER — ALBUTEROL SULFATE HFA 108 (90 BASE) MCG/ACT IN AERS: 2.0000 | INHALATION_SPRAY | Freq: Four times a day (QID) | RESPIRATORY_TRACT | Status: DC | PRN

## 2012-08-25 MED ORDER — FEBUXOSTAT 80 MG PO TABS: 80.0000 mg | ORAL_TABLET | Freq: Every day | ORAL | Status: DC

## 2012-08-25 MED ORDER — PHENTERMINE HCL 37.5 MG PO CAPS
37.5000 mg | ORAL_CAPSULE | ORAL | Status: DC
Start: 2012-08-25 — End: 2012-09-22

## 2012-08-25 MED ORDER — FLUTICASONE PROPIONATE 50 MCG/ACT NA SUSP: NASAL | Status: DC

## 2012-08-25 MED ORDER — OXYCODONE-ACETAMINOPHEN 7.5-325 MG PO TABS: 1.0000 | ORAL_TABLET | Freq: Two times a day (BID) | ORAL | Status: DC | PRN

## 2012-08-25 NOTE — Progress Notes (Signed)
CC: Jeremiah Long is a 37 y.o. male is here for Hypotension   Subjective: HPI:  Patient complaint of runny nose and nasal congestion for the past month. Seems to be worse on Sunday days when outside. Has responded to nasal steroids in the past but has run out. Symptoms are present most days, fluctuate between mild and moderate severity. Denies fevers, chills, bloody nose, dizziness, motor or sensory disturbances.  Requesting refills on uloric, he is uncertain last time he had a gout attack since it's been so long. Denies new joint pain joint swelling or joint redness.  Followup asthma: Has been using albuterol  Infrequently during the week and never at night. Requesting refill on albuterol. Denies shortness of breath, wheezing, cough  Requesting refill on oxycodone/acetaminophen. Denies change in character or severity or frequency of chronic back pain. Denies new radiation of symptoms, denies bowel bladder dysfunction, subtle paresthesia, nor extremity weakness.  Followup obesity: Taking phentermine daily, slowly getting back into daily exercise routine. Successfully try to watch what he eats.   Review Of Systems Outlined In HPI  Past Medical History  Diagnosis Date  . Chronic gout 11/02/2011  . Depression 12/16/2011  . Diabetes mellitus type 2 in obese 11/02/2011  . Edema 12/16/2011  . Hx of adenomatous colonic polyps 11/02/2011  . Hyperlipidemia LDL goal <160 11/02/2011  . Hypogonadism male 11/02/2011  . Hypothyroidism 11/02/2011  . Obesity, Class II, BMI 35-39.9, with comorbidity 12/16/2011  . Right C6 radiculopathy 12/16/2011     No family history on file.   History  Substance Use Topics  . Smoking status: Never Smoker   . Smokeless tobacco: Not on file  . Alcohol Use: No     Objective: Filed Vitals:   08/25/12 1054  BP: 117/73  Pulse: 90    Vital signs reviewed. General: Alert and Oriented, No Acute Distress HEENT: Pupils equal, round, reactive to light. Conjunctivae  clear.  External ears unremarkable.  Moist mucous membranes. Boggy erythematous inferior turbinates with moderate mucoid discharge, middle ears open and unremarkable bilaterally Lungs: Clear and comfortable work of breathing, speaking in full sentences without accessory muscle use. No wheezing rhonchi nor rales Cardiac: Regular rate and rhythm.  Neuro: CN II-XII grossly intact, gait normal. Extremities: No peripheral edema.  Strong peripheral pulses.  Mental Status: No depression, anxiety, nor agitation. Logical though process. Skin: Warm and dry.  Assessment & Plan: Jlon was seen today for hypotension.  Diagnoses and associated orders for this visit:  Chronic gout, without tophus - Febuxostat (ULORIC) 80 MG TABS; Take 1 tablet (80 mg total) by mouth daily.  Gout attack - Febuxostat (ULORIC) 80 MG TABS; Take 1 tablet (80 mg total) by mouth daily.  Asthma, chronic - albuterol (PROVENTIL HFA;VENTOLIN HFA) 108 (90 BASE) MCG/ACT inhaler; Inhale 2 puffs into the lungs every 6 (six) hours as needed for wheezing.  Seasonal allergies - fluticasone (FLONASE) 50 MCG/ACT nasal spray; Two sprays nostril daily.  Chronic back pain - oxyCODONE-acetaminophen (PERCOCET) 7.5-325 MG per tablet; Take 1 tablet by mouth 2 (two) times daily as needed for pain.  Obesity (BMI 30-39.9) - phentermine 37.5 MG capsule; Take 1 capsule (37.5 mg total) by mouth every morning.    Gout: Stable, continue uloric Asthma: Stable, continue albuterol. Seasonal allergies: Uncontrolled, restart Flonase Chronic back pain: Control, continue Percocet Obesity: Improving, continue phentermine  25 minutes spent face-to-face during visit today of which at least 50% was counseling or coordinating care regarding gout, asthma, seasonal allergies, chronic back pain,  obesity.   Return in about 4 weeks (around 09/22/2012).

## 2012-09-20 ENCOUNTER — Other Ambulatory Visit: Payer: Self-pay | Admitting: *Deleted

## 2012-09-20 DIAGNOSIS — E669 Obesity, unspecified: Secondary | ICD-10-CM

## 2012-09-20 DIAGNOSIS — E1169 Type 2 diabetes mellitus with other specified complication: Secondary | ICD-10-CM

## 2012-09-20 DIAGNOSIS — M109 Gout, unspecified: Secondary | ICD-10-CM

## 2012-09-20 MED ORDER — METFORMIN HCL ER (OSM) 1000 MG PO TB24: 2000.0000 mg | ORAL_TABLET | Freq: Every day | ORAL | Status: DC

## 2012-09-20 MED ORDER — FEBUXOSTAT 80 MG PO TABS: 80.0000 mg | ORAL_TABLET | Freq: Every day | ORAL | Status: DC

## 2012-09-22 ENCOUNTER — Encounter: Payer: Self-pay | Admitting: Family Medicine

## 2012-09-22 ENCOUNTER — Ambulatory Visit (INDEPENDENT_AMBULATORY_CARE_PROVIDER_SITE_OTHER): Admitting: Family Medicine

## 2012-09-22 VITALS — BP 121/80 | HR 99 | Wt 256.0 lb

## 2012-09-22 DIAGNOSIS — E669 Obesity, unspecified: Secondary | ICD-10-CM

## 2012-09-22 DIAGNOSIS — M549 Dorsalgia, unspecified: Secondary | ICD-10-CM

## 2012-09-22 DIAGNOSIS — M1611 Unilateral primary osteoarthritis, right hip: Secondary | ICD-10-CM

## 2012-09-22 DIAGNOSIS — M542 Cervicalgia: Secondary | ICD-10-CM

## 2012-09-22 DIAGNOSIS — G8929 Other chronic pain: Secondary | ICD-10-CM

## 2012-09-22 DIAGNOSIS — IMO0001 Reserved for inherently not codable concepts without codable children: Secondary | ICD-10-CM

## 2012-09-22 DIAGNOSIS — M169 Osteoarthritis of hip, unspecified: Secondary | ICD-10-CM

## 2012-09-22 MED ORDER — CELECOXIB 200 MG PO CAPS: 200.0000 mg | ORAL_CAPSULE | Freq: Two times a day (BID) | ORAL | Status: DC

## 2012-09-22 MED ORDER — PHENTERMINE HCL 37.5 MG PO CAPS: 37.5000 mg | ORAL_CAPSULE | ORAL | Status: DC

## 2012-09-22 MED ORDER — OXYCODONE-ACETAMINOPHEN 7.5-325 MG PO TABS: 1.0000 | ORAL_TABLET | Freq: Two times a day (BID) | ORAL | Status: DC | PRN

## 2012-09-22 NOTE — Progress Notes (Signed)
CC: Jeremiah Long is a 37 y.o. male is here for f/u on pain meds   Subjective: HPI:  Followup obesity: Patient continues to lose weight with success using phentermine, has helped improve portion control.  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 Denies irregular heartbeat chest pain, anxiety, paranoia, sleeping issues.  Followup chronic back pain with right C6 radiculopathy: States radiculopathy is not as bothersome as it has been in the past. Chronic back pain has not changed in severity frequency nor character. Denies new motor or sensory disturbances nor radiation of pain.  Patient complains of continued right hip pain is present on a daily basis. Worse with standing straight or lying down flat, improves with slight flexion of right hip. Pain is a localized deep between the groin and the outside of the hip with radiation slightly down into the thigh without any more cramping component. Symptoms are mild/moderate severity. Nothing else makes better or worse. Mobic and Flexeril not helping much. Denies dysuria, urinary frequency, testicular pain, right knee pain, nor motor or sensory nor weaknesses in the right lower extremity. Has not been able to afford physical therapy   Review Of Systems Outlined In HPI  Past Medical History  Diagnosis Date  . Chronic gout 11/02/2011  . Depression 12/16/2011  . Diabetes mellitus type 2 in obese 11/02/2011  . Edema 12/16/2011  . Hx of adenomatous colonic polyps 11/02/2011  . Hyperlipidemia LDL goal <160 11/02/2011  . Hypogonadism male 11/02/2011  . Hypothyroidism 11/02/2011  . Obesity, Class II, BMI 35-39.9, with comorbidity 12/16/2011  . Right C6 radiculopathy 12/16/2011     Family History  Problem Relation Age of Onset  . Stroke Mother      History  Substance Use Topics  . Smoking status: Never Smoker   . Smokeless tobacco: Not on file  . Alcohol Use: No     Objective: Filed Vitals:   09/22/12 1102  BP: 121/80  Pulse: 99     General: Alert and Oriented, No Acute Distress HEENT: Pupils equal, round, reactive to light. Conjunctivae clear.  Moist mucous membranes Lungs: Clear to auscultation bilaterally, no wheezing/ronchi/rales.  Comfortable work of breathing. Good air movement. Cardiac: Regular rate and rhythm. Normal S1/S2.  No murmurs, rubs, nor gallops.   Abdomen: Normal bowel sounds, soft and non tender without palpable masses. Extremities: No peripheral edema.  Strong peripheral pulses. RLE: Pain reproduced with full passive hip flexion, negative FADIR, pain reproduced with FABER, negative log roll, negative straight leg raise, pain slightly reproduced with palpation of greater trochanter Mental Status: No depression, anxiety, nor agitation. Skin: Warm and dry.  Assessment & Plan: Jeremiah Long was seen today for f/u on pain meds.  Diagnoses and associated orders for this visit:  Right C6 radiculopathy  Obesity, Class II, BMI 35-39.9, with comorbidity  Osteoarthritis of right hip  Obesity (BMI 30-39.9) - phentermine 37.5 MG capsule; Take 1 capsule (37.5 mg total) by mouth every morning.  Chronic back pain - oxyCODONE-acetaminophen (PERCOCET) 7.5-325 MG per tablet; Take 1 tablet by mouth 2 (two) times daily as needed for pain.  Other Orders - celecoxib (CELEBREX) 200 MG capsule; Take 1 capsule (200 mg total) by mouth 2 (two) times daily.    Chronic back pain and right C6 radiculopathy: Controlled and stable, continue present regimen Osteoarthritis of the right hip: Uncontrolled, stressed the importance of physical therapy, stop Mobic and will try Celebrex, samples given Obesity: Controlled and improving, continue phentermine  Return in about 4 weeks (around  10/20/2012).             

## 2012-09-27 ENCOUNTER — Telehealth: Payer: Self-pay | Admitting: Family Medicine

## 2012-09-27 DIAGNOSIS — M1611 Unilateral primary osteoarthritis, right hip: Secondary | ICD-10-CM

## 2012-09-27 MED ORDER — CELECOXIB 200 MG PO CAPS: 200.0000 mg | ORAL_CAPSULE | Freq: Two times a day (BID) | ORAL | Status: DC

## 2012-09-29 NOTE — Telephone Encounter (Signed)
Wife states celebrex working great for hip OA

## 2012-10-06 ENCOUNTER — Telehealth: Payer: Self-pay | Admitting: Family Medicine

## 2012-10-06 DIAGNOSIS — M109 Gout, unspecified: Secondary | ICD-10-CM

## 2012-10-06 MED ORDER — ALLOPURINOL 100 MG PO TABS: 100.0000 mg | ORAL_TABLET | Freq: Every day | ORAL | Status: DC

## 2012-10-06 MED ORDER — COLCHICINE 0.6 MG PO TABS: ORAL_TABLET | ORAL | Status: DC

## 2012-10-06 NOTE — Telephone Encounter (Signed)
Patient calls and states that he has not had any gout meds because his insurance is not gonna pay for the [place]. Dr. Ivan Anchors sent in different meds and pt notified. Barry Dienes, LPN

## 2012-10-16 ENCOUNTER — Telehealth: Payer: Self-pay | Admitting: *Deleted

## 2012-10-16 DIAGNOSIS — G8929 Other chronic pain: Secondary | ICD-10-CM

## 2012-10-16 DIAGNOSIS — M542 Cervicalgia: Secondary | ICD-10-CM

## 2012-10-16 DIAGNOSIS — M1611 Unilateral primary osteoarthritis, right hip: Secondary | ICD-10-CM

## 2012-10-16 MED ORDER — HYDROCODONE-ACETAMINOPHEN 7.5-325 MG PO TABS: 1.0000 | ORAL_TABLET | Freq: Two times a day (BID) | ORAL | Status: DC | PRN

## 2012-10-16 NOTE — Telephone Encounter (Signed)
Pt's wife called and states pt would like a refill on pain medication. Pt would like hydrocodone instead of percocet.Please advise

## 2012-10-16 NOTE — Telephone Encounter (Signed)
Sue Lush, Rx printed and placed in your inbox ready for pickup/fax.

## 2012-10-20 ENCOUNTER — Ambulatory Visit: Admitting: Family Medicine

## 2012-11-18 ENCOUNTER — Other Ambulatory Visit: Payer: Self-pay | Admitting: Family Medicine

## 2012-11-21 ENCOUNTER — Other Ambulatory Visit: Payer: Self-pay | Admitting: Family Medicine

## 2012-11-22 NOTE — Telephone Encounter (Signed)
Jeremiah Long, Rx in inbox ready for pickup/fax.

## 2012-11-29 ENCOUNTER — Telehealth: Payer: Self-pay | Admitting: *Deleted

## 2012-11-29 NOTE — Telephone Encounter (Signed)
Pt called and states derm wantes to put pt on Accutane however his triglycerides were extremely high. Last time they were checked here in Jan they were 1231. Derm suggested pt be on a med for this.please advise

## 2012-11-29 NOTE — Telephone Encounter (Signed)
Sounds like he's due for a fasting cholesterol panel to get a baseline of where he's at, will need an appointment to discuss medication options.

## 2012-11-30 NOTE — Telephone Encounter (Signed)
Pt notified and transferred to schedule appt.

## 2012-12-01 ENCOUNTER — Other Ambulatory Visit: Payer: Self-pay | Admitting: Family Medicine

## 2012-12-04 ENCOUNTER — Ambulatory Visit: Admitting: Family Medicine

## 2012-12-04 DIAGNOSIS — Z0289 Encounter for other administrative examinations: Secondary | ICD-10-CM

## 2012-12-12 ENCOUNTER — Other Ambulatory Visit: Payer: Self-pay | Admitting: Family Medicine

## 2012-12-13 ENCOUNTER — Telehealth: Payer: Self-pay | Admitting: Family Medicine

## 2012-12-13 NOTE — Telephone Encounter (Signed)
Pt wants to know if he will need to do bloodwork before his appt on 8/8

## 2012-12-22 ENCOUNTER — Encounter: Payer: Self-pay | Admitting: Family Medicine

## 2012-12-22 ENCOUNTER — Ambulatory Visit (INDEPENDENT_AMBULATORY_CARE_PROVIDER_SITE_OTHER): Admitting: Family Medicine

## 2012-12-22 VITALS — BP 114/67 | HR 94 | Wt 265.0 lb

## 2012-12-22 DIAGNOSIS — E291 Testicular hypofunction: Secondary | ICD-10-CM

## 2012-12-22 DIAGNOSIS — M1A00X0 Idiopathic chronic gout, unspecified site, without tophus (tophi): Secondary | ICD-10-CM

## 2012-12-22 DIAGNOSIS — E349 Endocrine disorder, unspecified: Secondary | ICD-10-CM

## 2012-12-22 DIAGNOSIS — E781 Pure hyperglyceridemia: Secondary | ICD-10-CM

## 2012-12-22 DIAGNOSIS — R609 Edema, unspecified: Secondary | ICD-10-CM

## 2012-12-22 DIAGNOSIS — E785 Hyperlipidemia, unspecified: Secondary | ICD-10-CM

## 2012-12-22 DIAGNOSIS — IMO0001 Reserved for inherently not codable concepts without codable children: Secondary | ICD-10-CM

## 2012-12-22 DIAGNOSIS — G8929 Other chronic pain: Secondary | ICD-10-CM

## 2012-12-22 DIAGNOSIS — M549 Dorsalgia, unspecified: Secondary | ICD-10-CM

## 2012-12-22 DIAGNOSIS — E669 Obesity, unspecified: Secondary | ICD-10-CM

## 2012-12-22 DIAGNOSIS — E538 Deficiency of other specified B group vitamins: Secondary | ICD-10-CM

## 2012-12-22 DIAGNOSIS — E119 Type 2 diabetes mellitus without complications: Secondary | ICD-10-CM

## 2012-12-22 MED ORDER — POTASSIUM CHLORIDE CRYS ER 20 MEQ PO TBCR: 20.0000 meq | EXTENDED_RELEASE_TABLET | Freq: Two times a day (BID) | ORAL | Status: DC

## 2012-12-22 MED ORDER — PHENTERMINE HCL 37.5 MG PO CAPS: 37.5000 mg | ORAL_CAPSULE | ORAL | Status: DC

## 2012-12-22 MED ORDER — OMEPRAZOLE 40 MG PO CPDR: 40.0000 mg | DELAYED_RELEASE_CAPSULE | Freq: Every day | ORAL | Status: DC

## 2012-12-22 MED ORDER — OXYCODONE-ACETAMINOPHEN 7.5-325 MG PO TABS: 1.0000 | ORAL_TABLET | Freq: Two times a day (BID) | ORAL | Status: DC

## 2012-12-22 NOTE — Progress Notes (Signed)
CC: Jeremiah Long is a 37 y.o. male is here for wt and bp  check   Subjective: HPI:  Followup hypertriglyceridemia: Patient is considering starting Accutane last lipid panel 6 months ago showed triglycerides over 1000 he was started on WelChol continues on Lipitor denies right upper quadrant pain myalgias nor skin or scleral discoloration  Obesity: He stopped taking phentermine last month as he forgot to followup for refill he has slowly been regaining weight and would like to return to phentermine he has no history of side effects to this medication  Gout: He is taking allopurinol and culture seen trying to focus on low protein diet and he does avoid alcohol quite easily. He cannot remember the last time he had a gout flare uric acid has not been attained the last 6 months  Type 2 diabetes: Continues on metformin on a daily basis denies polyuria polyphasia polydipsia has not had A1c in the last 3 months  Chronic back pain: Denies change in character or frequency of back pain but it has gone from mild to moderate since starting hydrocodone he believes oxycodone was working better the pain is still midline in the low back nonradiating without motor or sensory disturbances bowel incontinence nor bladder incontinence nor saddle paresthesia  Hypo-testosterone: He has noticed worsening fatigue over the past 2 months he has not been using testosterone injections for over 5 months, he does carry a history of remote low testosterone  Review Of Systems Outlined In HPI  Past Medical History  Diagnosis Date  . Chronic gout 11/02/2011  . Depression 12/16/2011  . Diabetes mellitus type 2 in obese 11/02/2011  . Edema 12/16/2011  . Hx of adenomatous colonic polyps 11/02/2011  . Hyperlipidemia LDL goal <160 11/02/2011  . Hypogonadism male 11/02/2011  . Hypothyroidism 11/02/2011  . Obesity, Class II, BMI 35-39.9, with comorbidity 12/16/2011  . Right C6 radiculopathy 12/16/2011     Family History  Problem  Relation Age of Onset  . Stroke Mother      History  Substance Use Topics  . Smoking status: Never Smoker   . Smokeless tobacco: Not on file  . Alcohol Use: No     Objective: Filed Vitals:   12/22/12 1546  BP: 114/67  Pulse: 94    General: Alert and Oriented, No Acute Distress HEENT: Pupils equal, round, reactive to light. Conjunctivae clear.  Moist mucous membranes pharynx unremarkable Lungs: Clear to auscultation bilaterally, no wheezing/ronchi/rales.  Comfortable work of breathing. Good air movement. Cardiac: Regular rate and rhythm. Normal S1/S2.  No murmurs, rubs, nor gallops.   Abdomen: Obese soft nontender Back: No midline spinous process tenderness in the lumbar or thoracic region nor paraspinal muscle tenderness Extremities: No peripheral edema.  Strong peripheral pulses.  Mental Status: No depression, anxiety, nor agitation. Skin: Warm and dry.  Assessment & Plan: Erol was seen today for wt and bp  check.  Diagnoses and associated orders for this visit:  Hypertriglyceridemia - Lipid panel  Hyperlipidemia LDL goal <160 - Lipid panel  Obesity, Class II, BMI 35-39.9, with comorbidity  Chronic gout, without tophus - Uric acid  Type 2 diabetes mellitus - Hemoglobin A1c  Edema - potassium chloride SA (K-DUR,KLOR-CON) 20 MEQ tablet; Take 1 tablet (20 mEq total) by mouth 2 (two) times daily.  Obesity (BMI 30-39.9) - phentermine 37.5 MG capsule; Take 1 capsule (37.5 mg total) by mouth every morning.  Chronic back pain - oxyCODONE-acetaminophen (PERCOCET) 7.5-325 MG per tablet; Take 1 tablet by mouth 2 (  two) times daily.  Hypotestosteronism - Testosterone, free, total  B12 deficiency - B12  Other Orders - omeprazole (PRILOSEC) 40 MG capsule; Take 1 capsule (40 mg total) by mouth daily.    Hypertriglyceridemia: He is due for recheck triglycerides and LDL fasting next week Obesity: Uncontrolled he will restart phentermine Gout: Controlled he is  due for uric acid level goal less than 6 Type 2 diabetes: Clinically controlled continue metformin overdue for A1c Chronic back pain: Worsened likely due to lower potency of hydrocodone will increase back to his old oxycodone regimen Hypo-testosterone: Will recheck a.m. testosterone and will add B12 to look into his fatigue  Return in about 4 weeks (around 01/19/2013).

## 2012-12-30 LAB — LIPID PANEL
HDL: 31 mg/dL — ABNORMAL LOW (ref >39)
Total CHOL/HDL Ratio: 7.1 Ratio

## 2012-12-30 LAB — HEMOGLOBIN A1C
Hgb A1c MFr Bld: 5.2 % (ref <5.7)
Mean Plasma Glucose: 103 mg/dL (ref <117)

## 2012-12-30 LAB — VITAMIN B12: Vitamin B-12: 675 pg/mL (ref 211–911)

## 2012-12-30 LAB — URIC ACID: Uric Acid, Serum: 4.6 mg/dL (ref 4.0–7.8)

## 2013-01-01 ENCOUNTER — Telehealth: Payer: Self-pay | Admitting: Family Medicine

## 2013-01-01 DIAGNOSIS — E291 Testicular hypofunction: Secondary | ICD-10-CM

## 2013-01-01 DIAGNOSIS — E781 Pure hyperglyceridemia: Secondary | ICD-10-CM

## 2013-01-01 DIAGNOSIS — E785 Hyperlipidemia, unspecified: Secondary | ICD-10-CM

## 2013-01-01 LAB — TESTOSTERONE, FREE, TOTAL, SHBG
Sex Hormone Binding: 12 nmol/L — ABNORMAL LOW (ref 13–71)
Testosterone, Free: 58.3 pg/mL (ref 47.0–244.0)
Testosterone-% Free: 3 % — ABNORMAL HIGH (ref 1.6–2.9)

## 2013-01-01 MED ORDER — FENOFIBRATE 145 MG PO TABS: 145.0000 mg | ORAL_TABLET | Freq: Every day | ORAL | Status: DC

## 2013-01-01 NOTE — Telephone Encounter (Signed)
Jeremiah Long, Will you please let Jeremiah Long know that his A1c and Uric Acid  And B12 are at goal. Testosterone is still deficient so if he'd like to restart is 200mg  injection every three weeks this is appropriate.  Triglycerides are still elevated at 1200, I'd encourage him to start on an additional triglyceride medication called fenofibrate whch I've sent to [facility] on [address].  It would be wise to recheck triglycerides in three months, however stop taking this if he develops diffuse muscle discomfort.

## 2013-01-01 NOTE — Telephone Encounter (Signed)
Pt notified and voiced understanding 

## 2013-01-12 ENCOUNTER — Telehealth: Payer: Self-pay | Admitting: Family Medicine

## 2013-01-12 DIAGNOSIS — G8929 Other chronic pain: Secondary | ICD-10-CM

## 2013-01-12 MED ORDER — GABAPENTIN 300 MG PO CAPS: 300.0000 mg | ORAL_CAPSULE | Freq: Three times a day (TID) | ORAL | Status: DC

## 2013-01-12 NOTE — Telephone Encounter (Signed)
Refill req 

## 2013-01-23 ENCOUNTER — Telehealth: Payer: Self-pay | Admitting: Family Medicine

## 2013-01-23 DIAGNOSIS — R45851 Suicidal ideations: Secondary | ICD-10-CM | POA: Insufficient documentation

## 2013-01-23 NOTE — Telephone Encounter (Signed)
See scanned document for suicidal admission

## 2013-01-26 ENCOUNTER — Ambulatory Visit: Admitting: Family Medicine

## 2013-01-29 ENCOUNTER — Ambulatory Visit (INDEPENDENT_AMBULATORY_CARE_PROVIDER_SITE_OTHER): Admitting: Family Medicine

## 2013-01-29 ENCOUNTER — Encounter: Payer: Self-pay | Admitting: Family Medicine

## 2013-01-29 VITALS — BP 121/81 | HR 81 | Wt 265.0 lb

## 2013-01-29 DIAGNOSIS — M549 Dorsalgia, unspecified: Secondary | ICD-10-CM

## 2013-01-29 DIAGNOSIS — E039 Hypothyroidism, unspecified: Secondary | ICD-10-CM

## 2013-01-29 DIAGNOSIS — F438 Other reactions to severe stress: Secondary | ICD-10-CM

## 2013-01-29 DIAGNOSIS — G8929 Other chronic pain: Secondary | ICD-10-CM

## 2013-01-29 DIAGNOSIS — E669 Obesity, unspecified: Secondary | ICD-10-CM

## 2013-01-29 DIAGNOSIS — F4329 Adjustment disorder with other symptoms: Secondary | ICD-10-CM | POA: Insufficient documentation

## 2013-01-29 MED ORDER — PHENTERMINE HCL 37.5 MG PO CAPS: 37.5000 mg | ORAL_CAPSULE | ORAL | Status: DC

## 2013-01-29 MED ORDER — MONTELUKAST SODIUM 10 MG PO TABS: 10.0000 mg | ORAL_TABLET | Freq: Every day | ORAL | Status: DC

## 2013-01-29 MED ORDER — OMEPRAZOLE 40 MG PO CPDR: 40.0000 mg | DELAYED_RELEASE_CAPSULE | Freq: Every day | ORAL | Status: DC

## 2013-01-29 MED ORDER — OXYCODONE-ACETAMINOPHEN 7.5-325 MG PO TABS: 1.0000 | ORAL_TABLET | Freq: Two times a day (BID) | ORAL | Status: DC

## 2013-01-29 MED ORDER — LEVOTHYROXINE SODIUM 150 MCG PO TABS: 150.0000 ug | ORAL_TABLET | Freq: Every day | ORAL | Status: DC
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
## 2013-02-08 ENCOUNTER — Other Ambulatory Visit: Payer: Self-pay | Admitting: Family Medicine

## 2013-02-23 ENCOUNTER — Ambulatory Visit (INDEPENDENT_AMBULATORY_CARE_PROVIDER_SITE_OTHER): Admitting: Family Medicine

## 2013-02-23 ENCOUNTER — Encounter: Payer: Self-pay | Admitting: Family Medicine

## 2013-02-23 VITALS — BP 139/92 | HR 91 | Wt 256.0 lb

## 2013-02-23 DIAGNOSIS — G8929 Other chronic pain: Secondary | ICD-10-CM

## 2013-02-23 DIAGNOSIS — R454 Irritability and anger: Secondary | ICD-10-CM

## 2013-02-23 DIAGNOSIS — M549 Dorsalgia, unspecified: Secondary | ICD-10-CM

## 2013-02-23 DIAGNOSIS — E669 Obesity, unspecified: Secondary | ICD-10-CM

## 2013-02-23 DIAGNOSIS — M62838 Other muscle spasm: Secondary | ICD-10-CM

## 2013-02-23 DIAGNOSIS — IMO0001 Reserved for inherently not codable concepts without codable children: Secondary | ICD-10-CM

## 2013-02-23 MED ORDER — HYDROXYZINE HCL 50 MG PO TABS: 50.0000 mg | ORAL_TABLET | Freq: Every day | ORAL | Status: DC | PRN

## 2013-02-23 MED ORDER — PHENTERMINE HCL 37.5 MG PO CAPS: 37.5000 mg | ORAL_CAPSULE | ORAL | Status: DC

## 2013-02-23 MED ORDER — OXYCODONE-ACETAMINOPHEN 7.5-325 MG PO TABS: 1.0000 | ORAL_TABLET | Freq: Two times a day (BID) | ORAL | Status: DC

## 2013-02-23 MED ORDER — TIZANIDINE HCL 4 MG PO TABS: 4.0000 mg | ORAL_TABLET | Freq: Three times a day (TID) | ORAL | Status: DC | PRN
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
## 2013-02-26 ENCOUNTER — Ambulatory Visit: Admitting: Family Medicine

## 2013-03-02 ENCOUNTER — Ambulatory Visit: Admitting: Family Medicine

## 2013-03-23 ENCOUNTER — Ambulatory Visit (INDEPENDENT_AMBULATORY_CARE_PROVIDER_SITE_OTHER): Admitting: Family Medicine

## 2013-03-23 ENCOUNTER — Encounter: Payer: Self-pay | Admitting: Family Medicine

## 2013-03-23 VITALS — BP 135/85 | HR 92 | Wt 258.0 lb

## 2013-03-23 DIAGNOSIS — M1A00X0 Idiopathic chronic gout, unspecified site, without tophus (tophi): Secondary | ICD-10-CM

## 2013-03-23 DIAGNOSIS — IMO0001 Reserved for inherently not codable concepts without codable children: Secondary | ICD-10-CM

## 2013-03-23 DIAGNOSIS — E669 Obesity, unspecified: Secondary | ICD-10-CM

## 2013-03-23 DIAGNOSIS — G8929 Other chronic pain: Secondary | ICD-10-CM

## 2013-03-23 DIAGNOSIS — M549 Dorsalgia, unspecified: Secondary | ICD-10-CM

## 2013-03-23 MED ORDER — OXYCODONE-ACETAMINOPHEN 7.5-325 MG PO TABS: 1.0000 | ORAL_TABLET | Freq: Two times a day (BID) | ORAL | Status: DC

## 2013-03-23 MED ORDER — FEBUXOSTAT 80 MG PO TABS: ORAL_TABLET | ORAL | Status: DC

## 2013-03-23 MED ORDER — PHENTERMINE HCL 37.5 MG PO CAPS: 37.5000 mg | ORAL_CAPSULE | ORAL | Status: DC

## 2013-03-23 MED ORDER — ORPHENADRINE CITRATE ER 100 MG PO TB12: 100.0000 mg | ORAL_TABLET | Freq: Two times a day (BID) | ORAL | Status: DC

## 2013-03-23 NOTE — Progress Notes (Signed)
CC: Jeremiah Long is a 37 y.o. male is here for wt and BP check and Back Pain   Subjective: HPI:  Followup obesity: Continues on phentermine daily basis without rapid heartbeat, chest pain, insomnia. He believes this is helping with portion control. He's had a stressful month with insurance issues and hospitalizations he thinks he has been stress eating to deal with this which is why his weight is up-to-date.  No formal exercise routine.  Followup gout: Requesting refills on Uloric.  He denies peripheral joint pain swelling or warmth. S he is unsure when his last gout attack was since it was so long ago.  Followup back pain: Continues to have stiffness and pain in the midline of the cervical spine radiates throughout the trapezius muscles bilaterally. Wakes him from sleep after 4 hours of sleeping every night. Nothing particularly makes the pain worse improved with oxycodone no benefit from Flexeril or Zanaflex denies new motor or sensory disturbances or weakness in the upper extremities..   Review Of Systems Outlined In HPI  Past Medical History  Diagnosis Date  . Chronic gout 11/02/2011  . Depression 12/16/2011  . Diabetes mellitus type 2 in obese 11/02/2011  . Edema 12/16/2011  . Hx of adenomatous colonic polyps 11/02/2011  . Hyperlipidemia LDL goal <160 11/02/2011  . Hypogonadism male 11/02/2011  . Hypothyroidism 11/02/2011  . Obesity, Class II, BMI 35-39.9, with comorbidity 12/16/2011  . Right C6 radiculopathy 12/16/2011     Family History  Problem Relation Age of Onset  . Stroke Mother      History  Substance Use Topics  . Smoking status: Never Smoker   . Smokeless tobacco: Not on file  . Alcohol Use: No     Objective: Filed Vitals:   03/23/13 1308  BP: 135/85  Pulse: 92    General: Alert and Oriented, No Acute Distress HEENT: Pupils equal, round, reactive to light. Conjunctivae clear.   moist membranes pharynx unremarkable back:  Lungs: Clear to auscultation  bilaterally, no wheezing/ronchi/rales.  Comfortable work of breathing. Good air movement. Cardiac: Regular rate and rhythm. Normal S1/S2.  No murmurs, rubs, nor gallops.    back: Full range of motion of the cervical spine pain is reproduced with palpation of trapezius muscle bilaterally upper belly, no midline spinous process tenderness, negative Spurling's. Moderately hypertonic upper muscle belly of trapezius  Extremities: No peripheral edema.  Strong peripheral pulses.  Mental Status: No depression, anxiety, nor agitation. Skin: Warm and dry.  Assessment & Plan: Hriday was seen today for wt and bp check and back pain.  Diagnoses and associated orders for this visit:  Obesity, Class II, BMI 35-39.9, with comorbidity  Obesity (BMI 30-39.9) - phentermine 37.5 MG capsule; Take 1 capsule (37.5 mg total) by mouth every morning.  Chronic back pain - orphenadrine (NORFLEX) 100 MG tablet; Take 1 tablet (100 mg total) by mouth 2 (two) times daily. - oxyCODONE-acetaminophen (PERCOCET) 7.5-325 MG per tablet; Take 1 tablet by mouth 2 (two) times daily.  Chronic gout, without tophus  Other Orders - Febuxostat 80 MG TABS; 80mg  by mouth daily     obesity: Uncontrolled encouraged to do physical exercise most days of the week, refill phentermine however if no loss in next month we'll have to stop phentermine change to something like Topamax Chronic back pain: Uncontrolled continue oxycodone, restart Norflex, if no improvement in 1-2 weeks could consider low dose of Valium Chronic gout: Controlled continue uloric  Return in about 4 weeks (around 04/20/2013).

## 2013-04-23 ENCOUNTER — Encounter: Payer: Self-pay | Admitting: Family Medicine

## 2013-04-23 ENCOUNTER — Ambulatory Visit (INDEPENDENT_AMBULATORY_CARE_PROVIDER_SITE_OTHER): Admitting: Family Medicine

## 2013-04-23 VITALS — BP 127/88 | HR 91 | Wt 263.0 lb

## 2013-04-23 DIAGNOSIS — E781 Pure hyperglyceridemia: Secondary | ICD-10-CM

## 2013-04-23 DIAGNOSIS — H538 Other visual disturbances: Secondary | ICD-10-CM

## 2013-04-23 DIAGNOSIS — M62838 Other muscle spasm: Secondary | ICD-10-CM

## 2013-04-23 DIAGNOSIS — Z23 Encounter for immunization: Secondary | ICD-10-CM

## 2013-04-23 DIAGNOSIS — M549 Dorsalgia, unspecified: Secondary | ICD-10-CM

## 2013-04-23 DIAGNOSIS — G8929 Other chronic pain: Secondary | ICD-10-CM

## 2013-04-23 MED ORDER — OXYCODONE-ACETAMINOPHEN 7.5-325 MG PO TABS: 1.0000 | ORAL_TABLET | Freq: Two times a day (BID) | ORAL | Status: DC

## 2013-04-23 MED ORDER — DIAZEPAM 5 MG PO TABS: ORAL_TABLET | ORAL | Status: DC

## 2013-04-23 MED ORDER — TOPIRAMATE 25 MG PO TABS: 25.0000 mg | ORAL_TABLET | Freq: Two times a day (BID) | ORAL | Status: DC
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
## 2013-04-29 ENCOUNTER — Other Ambulatory Visit: Payer: Self-pay | Admitting: Family Medicine

## 2013-05-21 ENCOUNTER — Encounter: Payer: Self-pay | Admitting: Family Medicine

## 2013-05-21 ENCOUNTER — Other Ambulatory Visit: Payer: Self-pay | Admitting: Family Medicine

## 2013-05-21 ENCOUNTER — Ambulatory Visit (INDEPENDENT_AMBULATORY_CARE_PROVIDER_SITE_OTHER): Admitting: Family Medicine

## 2013-05-21 VITALS — BP 123/85 | HR 70 | Temp 97.7°F | Wt 273.0 lb

## 2013-05-21 DIAGNOSIS — A499 Bacterial infection, unspecified: Secondary | ICD-10-CM

## 2013-05-21 DIAGNOSIS — R5381 Other malaise: Secondary | ICD-10-CM

## 2013-05-21 DIAGNOSIS — J329 Chronic sinusitis, unspecified: Secondary | ICD-10-CM

## 2013-05-21 DIAGNOSIS — R5383 Other fatigue: Secondary | ICD-10-CM

## 2013-05-21 DIAGNOSIS — G8929 Other chronic pain: Secondary | ICD-10-CM

## 2013-05-21 DIAGNOSIS — M549 Dorsalgia, unspecified: Secondary | ICD-10-CM

## 2013-05-21 DIAGNOSIS — B9689 Other specified bacterial agents as the cause of diseases classified elsewhere: Secondary | ICD-10-CM

## 2013-05-21 MED ORDER — HYDROCODONE-HOMATROPINE 5-1.5 MG/5ML PO SYRP: 5.0000 mL | ORAL_SOLUTION | Freq: Three times a day (TID) | ORAL | Status: DC

## 2013-05-21 MED ORDER — AMOXICILLIN-POT CLAVULANATE 500-125 MG PO TABS: ORAL_TABLET | ORAL | Status: AC

## 2013-05-21 MED ORDER — OXYCODONE-ACETAMINOPHEN 7.5-325 MG PO TABS
1.0000 | ORAL_TABLET | Freq: Two times a day (BID) | ORAL | Status: DC
Start: 2013-05-21 — End: 2013-06-18

## 2013-05-21 MED ORDER — OXYCODONE-ACETAMINOPHEN 7.5-325 MG PO TABS: 1.0000 | ORAL_TABLET | Freq: Two times a day (BID) | ORAL | Status: DC

## 2013-05-21 MED ORDER — AMOXICILLIN-POT CLAVULANATE 500-125 MG PO TABS: ORAL_TABLET | ORAL | Status: DC

## 2013-05-21 NOTE — Progress Notes (Signed)
CC: Jeremiah Long is a 38 y.o. male is here for Cough   Subjective: HPI:  Complains of nasal congestion moderate in severity accompanied by facial pressure moderate in severity localized above and below both eyes. Symptoms have been present for 2 weeks. Accompanied by nonproductive cough ear symptoms all worse in the evening. Slightly improved with Tamiflu nothing else makes better or worse. Cough is interfering with sleep on nightly basis. Has been slowly worsening since onset. Has tried a Z-Pak without any benefit. Denies shortness of breath, wheezing, chest discomfort, back pain.  States that back pain is slightly improved ever since taking low-dose Valium. Pain is still localized in the posterior neck however does not seem to radiate as bad into the back of the shoulders.  Patient reports fatigue of moderate severity that has been present ever since the winter began. He is wanting to know if his vitamin D level is appropriate.   Review Of Systems Outlined In HPI  Past Medical History  Diagnosis Date  . Chronic gout 11/02/2011  . Depression 12/16/2011  . Diabetes mellitus type 2 in obese 11/02/2011  . Edema 12/16/2011  . Hx of adenomatous colonic polyps 11/02/2011  . Hyperlipidemia LDL goal <160 11/02/2011  . Hypogonadism male 11/02/2011  . Hypothyroidism 11/02/2011  . Obesity, Class II, BMI 35-39.9, with comorbidity 12/16/2011  . Right C6 radiculopathy 12/16/2011     Family History  Problem Relation Age of Onset  . Stroke Mother      History  Substance Use Topics  . Smoking status: Never Smoker   . Smokeless tobacco: Not on file  . Alcohol Use: No     Objective: Filed Vitals:   05/21/13 1342  BP: 123/85  Pulse: 70  Temp: 97.7 F (36.5 C)    General: Alert and Oriented, No Acute Distress HEENT: Pupils equal, round, reactive to light. Conjunctivae clear.  External ears unremarkable, canals clear with intact TMs with appropriate landmarks.  Middle ear appears open without  effusion. Pink inferior turbinates.  Moist mucous membranes, pharynx without inflammation nor lesions however moderate cobblestoning.  Neck supple without palpable lymphadenopathy nor abnormal masses. Maxillary sinus tenderness to percussion bilaterally Lungs: Clear to auscultation bilaterally, no wheezing/ronchi/rales.  Comfortable work of breathing. Good air movement. Back: Mild paraspinal musculature tenderness in the cervical region bilaterally, full range of motion strength of the cervical spine. No spinous process tenderness in the cervical spine Extremities: No peripheral edema.  Strong peripheral pulses.  Mental Status: No depression, anxiety, nor agitation. Skin: Warm and dry.  Assessment & Plan: Jeremiah Long was seen today for cough.  Diagnoses and associated orders for this visit:  Chronic back pain - Discontinue: oxyCODONE-acetaminophen (PERCOCET) 7.5-325 MG per tablet; Take 1 tablet by mouth 2 (two) times daily. - oxyCODONE-acetaminophen (PERCOCET) 7.5-325 MG per tablet; Take 1 tablet by mouth 2 (two) times daily.  Bacterial sinusitis - Discontinue: HYDROcodone-homatropine (HYCODAN) 5-1.5 MG/5ML syrup; Take 5 mLs by mouth every 8 (eight) hours. - Discontinue: amoxicillin-clavulanate (AUGMENTIN) 500-125 MG per tablet; Take one by mouth every 8 hours for ten total days. - HYDROcodone-homatropine (HYCODAN) 5-1.5 MG/5ML syrup; Take 5 mLs by mouth every 8 (eight) hours. - amoxicillin-clavulanate (AUGMENTIN) 500-125 MG per tablet; Take one by mouth every 8 hours for ten total days.  Fatigue - Vit D  25 hydroxy (rtn osteoporosis monitoring)    Chronic back pain: Controlled continue Percocet and as needed low-dose Valium Actual sinusitis: Start Augmentin continue TheraFlu as needed may add Hycodan to help with  sleep Fatigue: Checking vitamin D  Return in about 4 weeks (around 06/18/2013), or if symptoms worsen or fail to improve.

## 2013-06-18 ENCOUNTER — Encounter: Payer: Self-pay | Admitting: Family Medicine

## 2013-06-18 ENCOUNTER — Ambulatory Visit: Admitting: Family Medicine

## 2013-06-18 ENCOUNTER — Ambulatory Visit (INDEPENDENT_AMBULATORY_CARE_PROVIDER_SITE_OTHER): Admitting: Family Medicine

## 2013-06-18 VITALS — BP 117/91 | HR 96 | Temp 99.3°F | Wt 265.0 lb

## 2013-06-18 DIAGNOSIS — M549 Dorsalgia, unspecified: Secondary | ICD-10-CM

## 2013-06-18 DIAGNOSIS — B9689 Other specified bacterial agents as the cause of diseases classified elsewhere: Secondary | ICD-10-CM

## 2013-06-18 DIAGNOSIS — J329 Chronic sinusitis, unspecified: Secondary | ICD-10-CM

## 2013-06-18 DIAGNOSIS — M62838 Other muscle spasm: Secondary | ICD-10-CM

## 2013-06-18 DIAGNOSIS — M542 Cervicalgia: Secondary | ICD-10-CM

## 2013-06-18 DIAGNOSIS — A499 Bacterial infection, unspecified: Secondary | ICD-10-CM

## 2013-06-18 DIAGNOSIS — G8929 Other chronic pain: Secondary | ICD-10-CM

## 2013-06-18 MED ORDER — CELECOXIB 200 MG PO CAPS: ORAL_CAPSULE | ORAL | Status: DC

## 2013-06-18 MED ORDER — TOPIRAMATE 25 MG PO TABS: 25.0000 mg | ORAL_TABLET | Freq: Two times a day (BID) | ORAL | Status: DC

## 2013-06-18 MED ORDER — GABAPENTIN 300 MG PO CAPS: 300.0000 mg | ORAL_CAPSULE | Freq: Three times a day (TID) | ORAL | Status: DC

## 2013-06-18 MED ORDER — LEVOFLOXACIN 500 MG PO TABS: 500.0000 mg | ORAL_TABLET | Freq: Every day | ORAL | Status: DC

## 2013-06-18 MED ORDER — OXYCODONE-ACETAMINOPHEN 7.5-325 MG PO TABS: 1.0000 | ORAL_TABLET | Freq: Two times a day (BID) | ORAL | Status: DC

## 2013-06-18 MED ORDER — DIAZEPAM 5 MG PO TABS: ORAL_TABLET | ORAL | Status: DC

## 2013-06-18 MED ORDER — HYDROCODONE-HOMATROPINE 5-1.5 MG/5ML PO SYRP: 5.0000 mL | ORAL_SOLUTION | Freq: Three times a day (TID) | ORAL | Status: DC

## 2013-06-18 NOTE — Progress Notes (Signed)
CC: Jeremiah Long is a 38 y.o. male is here for Medication Management and Cough   Subjective: HPI:  Followup chronic neck and back pain: Still localizes pain to the midline of the base of the posterior neck. Pain is nonradiating is described only as pain mild in severity on his current oxycodone regimen. Confirms he is taking it only as prescribed.  Denies any new character or severity change in his pain. He believes it's gotten significantly better since starting low-dose Valium which he is mostly taking only in the evening. Denies weakness or motor or sensory disturbances in the upper extremities.  Followup obesity: He believes he is getting some mild appetite suppression since starting on Topamax. He denies any mental disturbance, confusion, nor any paresthesias since taking the medication.  Patient complains of facial pressure localized above both eyes and has been present for what he believes has been since December to some degree. It significantly improved after I saw him last month and started him on Augmentin. It has been worsening over the past week. Accompanied by a nonproductive cough and subjective postnasal drip with nasal congestion. Over-the-counter cough and cold medications have not been helping much. Denies fevers, chills, shortness of breath, wheezing, nor chest discomfort. Symptoms are significantly worse in the evening and interfere with sleep   Review Of Systems Outlined In HPI  Past Medical History  Diagnosis Date  . Chronic gout 11/02/2011  . Depression 12/16/2011  . Diabetes mellitus type 2 in obese 11/02/2011  . Edema 12/16/2011  . Hx of adenomatous colonic polyps 11/02/2011  . Hyperlipidemia LDL goal <160 11/02/2011  . Hypogonadism male 11/02/2011  . Hypothyroidism 11/02/2011  . Obesity, Class II, BMI 35-39.9, with comorbidity 12/16/2011  . Right C6 radiculopathy 12/16/2011     Family History  Problem Relation Age of Onset  . Stroke Mother      History   Substance Use Topics  . Smoking status: Never Smoker   . Smokeless tobacco: Not on file  . Alcohol Use: No     Objective: Filed Vitals:   06/18/13 1432  BP: 117/91  Pulse: 96  Temp: 99.3 F (37.4 C)    General: Alert and Oriented, No Acute Distress HEENT: Pupils equal, round, reactive to light. Conjunctivae clear.  External ears unremarkable, canals clear with intact TMs with appropriate landmarks.  Middle ear appears open without effusion. Pink inferior turbinates.  Moist mucous membranes, pharynx without inflammation nor lesions however moderate cobblestoning and postnasal drip.  Neck supple without palpable lymphadenopathy nor abnormal masses. Frontal sinus tenderness bilaterally to percussion Lungs: Clear to auscultation bilaterally, no wheezing/ronchi/rales.  Comfortable work of breathing. Good air movement. Cardiac: Regular rate and rhythm. Normal S1/S2.  No murmurs, rubs, nor gallops.   Extremities: No peripheral edema.  Strong peripheral pulses. Mildly hypertonic upper trapezius bilaterally. Full range of motion strength in both upper extremities. Full range of motion of the C-spine. Mental Status: No depression, anxiety, nor agitation. Skin: Warm and dry.  Assessment & Plan: Jeremiah Long was seen today for medication management and cough.  Diagnoses and associated orders for this visit:  Chronic neck pain - celecoxib (CELEBREX) 200 MG capsule; TAKE ONE CAPSULE TWICE A DAY - gabapentin (NEURONTIN) 300 MG capsule; Take 1 capsule (300 mg total) by mouth 3 (three) times daily.  Chronic back pain - oxyCODONE-acetaminophen (PERCOCET) 7.5-325 MG per tablet; Take 1 tablet by mouth 2 (two) times daily.  Bacterial sinusitis - HYDROcodone-homatropine (HYCODAN) 5-1.5 MG/5ML syrup; Take 5 mLs by mouth  every 8 (eight) hours. - levofloxacin (LEVAQUIN) 500 MG tablet; Take 1 tablet (500 mg total) by mouth daily.  Muscle spasm - diazepam (VALIUM) 5 MG tablet; One by mouth every night to  help with pain/sleep, may take one additional dose in the daytime as needed for neck pain.  Morbid obesity - Discontinue: topiramate (TOPAMAX) 25 MG tablet; Take 1 tablet (25 mg total) by mouth 2 (two) times daily. - topiramate (TOPAMAX) 25 MG tablet; Take 1 tablet (25 mg total) by mouth 2 (two) times daily.    Chronic neck and back pain: Controlled with oxycodone and Valium and Celebrex and Neurontin continue current regimen Actual sinusitis: Start Levaquin and Hycodan to help with sleep Obesity: Improving continue Topamax  25 minutes spent face-to-face during visit today of which at least 50% was counseling or coordinating care regarding: 1. Chronic neck pain   2. Chronic back pain   3. Bacterial sinusitis   4. Muscle spasm   5. Morbid obesity      Return in about 3 months (around 09/15/2013).

## 2013-06-19 ENCOUNTER — Telehealth: Payer: Self-pay | Admitting: Family Medicine

## 2013-06-19 DIAGNOSIS — E781 Pure hyperglyceridemia: Secondary | ICD-10-CM

## 2013-06-19 LAB — LIPID PANEL
Cholesterol: 197 mg/dL (ref 0–200)
HDL: 32 mg/dL — ABNORMAL LOW (ref >39)
Total CHOL/HDL Ratio: 6.2 Ratio
Triglycerides: 1183 mg/dL — ABNORMAL HIGH (ref <150)

## 2013-06-19 LAB — HEMOGLOBIN A1C
Hgb A1c MFr Bld: 5.3 % (ref <5.7)
Mean Plasma Glucose: 105 mg/dL (ref <117)

## 2013-06-19 LAB — VITAMIN D 25 HYDROXY (VIT D DEFICIENCY, FRACTURES): Vit D, 25-Hydroxy: 22 ng/mL — ABNORMAL LOW (ref 30–89)

## 2013-06-19 MED ORDER — FENOFIBRATE 145 MG PO TABS: 145.0000 mg | ORAL_TABLET | Freq: Every day | ORAL | Status: DC

## 2013-06-19 MED ORDER — VITAMIN D (ERGOCALCIFEROL) 1.25 MG (50000 UNIT) PO CAPS: 50000.0000 [IU] | ORAL_CAPSULE | ORAL | Status: DC

## 2013-06-19 MED ORDER — ICOSAPENT ETHYL 1 G PO CAPS: 2.0000 | ORAL_CAPSULE | Freq: Two times a day (BID) | ORAL | Status: DC

## 2013-06-19 NOTE — Telephone Encounter (Signed)
Jeremiah Long, Will you please let Jeremiah Long know that his triglycerides remain over 1,000 with a goal of less than 150.  I would encourage him to start a medication called vascepa which is essentially purified fish oil.  This would act as a supplement to the fenofibrate he's already taking.  A1c was perfect but Vitamin D was low so I've also sent over an rx for a weekly Vitamin D supplement.

## 2013-06-19 NOTE — Telephone Encounter (Signed)
Pt.notified

## 2013-06-26 ENCOUNTER — Telehealth: Payer: Self-pay | Admitting: *Deleted

## 2013-06-26 NOTE — Telephone Encounter (Signed)
PA approved for Vascepa 1G capsule.  Meyer CoryMisty Ahmad, LPN

## 2013-07-16 ENCOUNTER — Ambulatory Visit (INDEPENDENT_AMBULATORY_CARE_PROVIDER_SITE_OTHER): Admitting: Family Medicine

## 2013-07-16 ENCOUNTER — Encounter: Payer: Self-pay | Admitting: Family Medicine

## 2013-07-16 VITALS — BP 130/89 | HR 85 | Wt 273.0 lb

## 2013-07-16 DIAGNOSIS — E559 Vitamin D deficiency, unspecified: Secondary | ICD-10-CM | POA: Insufficient documentation

## 2013-07-16 DIAGNOSIS — M62838 Other muscle spasm: Secondary | ICD-10-CM

## 2013-07-16 DIAGNOSIS — Z630 Problems in relationship with spouse or partner: Secondary | ICD-10-CM

## 2013-07-16 DIAGNOSIS — Z7189 Other specified counseling: Secondary | ICD-10-CM

## 2013-07-16 DIAGNOSIS — M549 Dorsalgia, unspecified: Secondary | ICD-10-CM

## 2013-07-16 DIAGNOSIS — J45909 Unspecified asthma, uncomplicated: Secondary | ICD-10-CM

## 2013-07-16 DIAGNOSIS — G8929 Other chronic pain: Secondary | ICD-10-CM

## 2013-07-16 MED ORDER — OXYCODONE-ACETAMINOPHEN 7.5-325 MG PO TABS: 1.0000 | ORAL_TABLET | Freq: Two times a day (BID) | ORAL | Status: DC

## 2013-07-16 MED ORDER — DIAZEPAM 5 MG PO TABS: ORAL_TABLET | ORAL | Status: DC

## 2013-07-16 MED ORDER — PREDNISONE 20 MG PO TABS: ORAL_TABLET | ORAL | Status: AC

## 2013-07-16 NOTE — Progress Notes (Signed)
CC: Jeremiah Long is a 38 y.o. male is here for 1 mon f/u   Subjective: HPI:  Followup chronic back pain: Continues to localize pain to the base of the neck midline posterior nonradiating since I saw him last. Denies change in character severity or frequency of pain. Greatly improved when taking oxycodone one dose twice a day and occasional Valium the latter part which is helping greatly with muscle hypertonicity in the upper back.  Denies any motor or sensory disturbances in the extremities nor weakness. Denies any new over exertion or trauma to the neck.  Acute complaint of wheezing and nonproductive cough that has been present for the past 2-3 weeks. It is precipitated by going out into cold weather gets better slowly after matter of days of avoiding cold weather. Albuterol has slightly helped but does not result symptoms. No other interventions as of yet.  Denies facial pressure, nasal congestion, chest pain, confusion orthopnea nor peripheral edema.  Acute complaint of stress at home due to wife's lack of interest in former hobbies, subjective depression, and decreased motivation over the past month. Patient expresses frustration with not knowing what to do to help with her symptoms. Moist behavior is present on a daily basis has slightly worsened over the past month. Patient states this affects his quality of life.   Review Of Systems Outlined In HPI  Past Medical History  Diagnosis Date  . Chronic gout 11/02/2011  . Depression 12/16/2011  . Diabetes mellitus type 2 in obese 11/02/2011  . Edema 12/16/2011  . Hx of adenomatous colonic polyps 11/02/2011  . Hyperlipidemia LDL goal <160 11/02/2011  . Hypogonadism male 11/02/2011  . Hypothyroidism 11/02/2011  . Obesity, Class II, BMI 35-39.9, with comorbidity 12/16/2011  . Right C6 radiculopathy 12/16/2011    No past surgical history on file. Family History  Problem Relation Age of Onset  . Stroke Mother     History   Social History  .  Marital Status: Married    Spouse Name: N/A    Number of Children: N/A  . Years of Education: N/A   Occupational History  . Not on file.   Social History Main Topics  . Smoking status: Never Smoker   . Smokeless tobacco: Not on file  . Alcohol Use: No  . Drug Use: No  . Sexual Activity: Not on file   Other Topics Concern  . Not on file   Social History Narrative  . No narrative on file     Objective: BP 130/89  Pulse 85  Wt 273 lb (123.832 kg)  General: Alert and Oriented, No Acute Distress HEENT: Pupils equal, round, reactive to light. Conjunctivae clear.  External ears unremarkable, canals clear with intact TMs with appropriate landmarks.  Middle ear appears open without effusion. Pink inferior turbinates.  Moist mucous membranes, pharynx without inflammation nor lesions.  Neck supple without palpable lymphadenopathy nor abnormal masses. Lungs: Clear to auscultation bilaterally, no wheezing/ronchi/rales.  Comfortable work of breathing. Good air movement. Cardiac: Regular rate and rhythm. Normal S1/S2.  No murmurs, rubs, nor gallops.   Extremities: No peripheral edema.  Strong peripheral pulses. Full range of motion strength in the upper extremities no midline spinous process tenderness in the C-spine neck has full range of motion  Mental Status: No depression, anxiety, nor agitation. Skin: Warm and dry.  Assessment & Plan: Lum was seen today for 1 mon f/u.  Diagnoses and associated orders for this visit:  Chronic back pain - oxyCODONE-acetaminophen (PERCOCET) 7.5-325  MG per tablet; Take 1 tablet by mouth 2 (two) times daily.  Muscle spasm - diazepam (VALIUM) 5 MG tablet; One by mouth every night to help with pain/sleep, may take one additional dose in the daytime as needed for neck pain.  Reactive airway disease - predniSONE (DELTASONE) 20 MG tablet; Three tabs at once daily for five days.  Marital stress    Chronic back pain: Controlled continue as needed  Percocet and Valium for muscle spasm component Reactive airway disease: Mild exacerbation due to cold exposure start prednisone burst no indication for antibiotics at this time Marital stress: Support was provided brief counseling as well that the most important thing he can do to help his wife and thus  his quality of life would be to have her return to see me as soon as possible   25 minutes spent face-to-face during visit today of which at least 50% was counseling or coordinating care regarding: 1. Chronic back pain   2. Muscle spasm   3. Reactive airway disease   4. Marital stress       Return in about 3 months (around 10/16/2013).

## 2013-07-28 ENCOUNTER — Other Ambulatory Visit: Payer: Self-pay | Admitting: Family Medicine

## 2013-08-10 ENCOUNTER — Telehealth: Payer: Self-pay | Admitting: Family Medicine

## 2013-08-10 DIAGNOSIS — IMO0002 Reserved for concepts with insufficient information to code with codable children: Secondary | ICD-10-CM

## 2013-08-10 NOTE — Telephone Encounter (Signed)
Referral request

## 2013-08-13 ENCOUNTER — Encounter: Payer: Self-pay | Admitting: Family Medicine

## 2013-08-13 ENCOUNTER — Ambulatory Visit (INDEPENDENT_AMBULATORY_CARE_PROVIDER_SITE_OTHER): Admitting: Family Medicine

## 2013-08-13 VITALS — BP 118/80 | HR 104 | Wt 250.0 lb

## 2013-08-13 DIAGNOSIS — E291 Testicular hypofunction: Secondary | ICD-10-CM

## 2013-08-13 DIAGNOSIS — F4321 Adjustment disorder with depressed mood: Secondary | ICD-10-CM

## 2013-08-13 DIAGNOSIS — M549 Dorsalgia, unspecified: Secondary | ICD-10-CM

## 2013-08-13 DIAGNOSIS — M62838 Other muscle spasm: Secondary | ICD-10-CM

## 2013-08-13 DIAGNOSIS — E538 Deficiency of other specified B group vitamins: Secondary | ICD-10-CM

## 2013-08-13 DIAGNOSIS — G8929 Other chronic pain: Secondary | ICD-10-CM

## 2013-08-13 MED ORDER — TESTOSTERONE CYPIONATE 200 MG/ML IM SOLN: INTRAMUSCULAR | Status: DC

## 2013-08-13 MED ORDER — TOPIRAMATE 25 MG PO TABS: 25.0000 mg | ORAL_TABLET | Freq: Two times a day (BID) | ORAL | Status: DC

## 2013-08-13 MED ORDER — OXYCODONE-ACETAMINOPHEN 7.5-325 MG PO TABS: 1.0000 | ORAL_TABLET | Freq: Two times a day (BID) | ORAL | Status: DC

## 2013-08-13 MED ORDER — CYANOCOBALAMIN 1000 MCG/ML IJ SOLN: 1000.0000 ug | INTRAMUSCULAR | Status: DC

## 2013-08-13 NOTE — Progress Notes (Signed)
CC: Jeremiah Long is a 38 y.o. male is here for Follow-up   Subjective: HPI:  Followup chronic neck pain: Patient states that he has stopped taking Valium because he is not having difficulty with neck pain interfering with sleep. He continues to have moderate pain localized posteriorly in a C-spine low in the neck nonradiating. Denies motor or sensory disturbances in the upper extremity nor recent injury to the neck.  Pain is 100% relieved with Percocet.  Obesity: He believes she's getting some mild benefit with appetite suppression from Topamax without any known side effects.  Hypogonadism: He has run out of testosterone at home he was given his to himself once a month. Denies any exertional chest pain or nor any chest pain at all. Denies fatigue.  Followup B12 deficiency: Has run out of B12 that he was giving himself on a monthly basis.  Shares the sad news that father passed away 2 weeks ago, patient endorses subjective depression which is mild in severity but denies any other mental disturbance   Review Of Systems Outlined In HPI  Past Medical History  Diagnosis Date  . Chronic gout 11/02/2011  . Depression 12/16/2011  . Diabetes mellitus type 2 in obese 11/02/2011  . Edema 12/16/2011  . Hx of adenomatous colonic polyps 11/02/2011  . Hyperlipidemia LDL goal <160 11/02/2011  . Hypogonadism male 11/02/2011  . Hypothyroidism 11/02/2011  . Obesity, Class II, BMI 35-39.9, with comorbidity 12/16/2011  . Right C6 radiculopathy 12/16/2011    No past surgical history on file. Family History  Problem Relation Age of Onset  . Stroke Mother     History   Social History  . Marital Status: Married    Spouse Name: N/A    Number of Children: N/A  . Years of Education: N/A   Occupational History  . Not on file.   Social History Main Topics  . Smoking status: Never Smoker   . Smokeless tobacco: Not on file  . Alcohol Use: No  . Drug Use: No  . Sexual Activity: Not on file   Other  Topics Concern  . Not on file   Social History Narrative  . No narrative on file     Objective: BP 118/80  Pulse 104  Wt 250 lb (113.399 kg)  General: Alert and Oriented, No Acute Distress HEENT: Pupils equal, round, reactive to light. Conjunctivae clear.  Moist mucous membranes pharynx unremarkable Lungs: Clear to auscultation bilaterally, no wheezing/ronchi/rales.  Comfortable work of breathing. Good air movement. Cardiac: Regular rate and rhythm. Normal S1/S2.  No murmurs, rubs, nor gallops.   Back: No midline spinous process tenderness in the C-spine nor paraspinal musculature hypertonicity or pain with palpation. Full range of motion of the C-spine. Extremities: No peripheral edema.  Strong peripheral pulses. Full range of motion and strength in bilateral upper extremities Mental Status: Mild depression. No anxiety, nor agitation. Skin: Warm and dry.  Assessment & Plan: Jeremiah Long was seen today for follow-up.  Diagnoses and associated orders for this visit:  Muscle spasm  Chronic back pain - oxyCODONE-acetaminophen (PERCOCET) 7.5-325 MG per tablet; Take 1 tablet by mouth 2 (two) times daily.  Morbid obesity - topiramate (TOPAMAX) 25 MG tablet; Take 1 tablet (25 mg total) by mouth 2 (two) times daily.  B12 deficiency - cyanocobalamin (,VITAMIN B-12,) 1000 MCG/ML injection; Inject 1 mL (1,000 mcg total) into the muscle every 30 (thirty) days.  Hypogonadism male - testosterone cypionate (DEPOTESTOTERONE CYPIONATE) 200 MG/ML injection; 200mg  IM every 21 days.  Grief    Muscle spasm with chronic back pain: Controlled with Percocet Obesity: Improving with Topamax B12 deficiency: Stable with monthly injections at home Hypogonadism: Continue testosterone injections at home with her previously prescribed regimen of 200 mg every 21 days  Grief: Patient states that symptoms of depression or not interfering with quality of life. He has declined counseling with hospice and  politely declines antidepressants understandably today.  25 minutes spent face-to-face during visit today of which at least 50% was counseling or coordinating care regarding: 1. Muscle spasm   2. Chronic back pain   3. Morbid obesity   4. B12 deficiency   5. Hypogonadism male   6. Grief       Return in about 2 months (around 10/13/2013).

## 2013-09-10 ENCOUNTER — Encounter: Payer: Self-pay | Admitting: Family Medicine

## 2013-09-10 ENCOUNTER — Ambulatory Visit (INDEPENDENT_AMBULATORY_CARE_PROVIDER_SITE_OTHER): Admitting: Family Medicine

## 2013-09-10 VITALS — BP 136/89 | HR 89 | Wt 278.0 lb

## 2013-09-10 DIAGNOSIS — G8929 Other chronic pain: Secondary | ICD-10-CM

## 2013-09-10 DIAGNOSIS — E781 Pure hyperglyceridemia: Secondary | ICD-10-CM

## 2013-09-10 DIAGNOSIS — M549 Dorsalgia, unspecified: Secondary | ICD-10-CM

## 2013-09-10 DIAGNOSIS — J45909 Unspecified asthma, uncomplicated: Secondary | ICD-10-CM

## 2013-09-10 DIAGNOSIS — E559 Vitamin D deficiency, unspecified: Secondary | ICD-10-CM

## 2013-09-10 MED ORDER — OXYCODONE-ACETAMINOPHEN 7.5-325 MG PO TABS: 1.0000 | ORAL_TABLET | Freq: Two times a day (BID) | ORAL | Status: DC

## 2013-09-10 MED ORDER — PHENTERMINE HCL 37.5 MG PO TABS: 37.5000 mg | ORAL_TABLET | Freq: Every day | ORAL | Status: DC

## 2013-09-10 MED ORDER — ALBUTEROL SULFATE HFA 108 (90 BASE) MCG/ACT IN AERS: 2.0000 | INHALATION_SPRAY | Freq: Four times a day (QID) | RESPIRATORY_TRACT | Status: DC | PRN

## 2013-09-10 MED ORDER — MONTELUKAST SODIUM 10 MG PO TABS: 10.0000 mg | ORAL_TABLET | Freq: Every day | ORAL | Status: DC

## 2013-09-10 NOTE — Progress Notes (Signed)
CC: Jeremiah Long is a 38 y.o. male is here for 1 month f/u   Subjective: HPI:  Followup obesity: Continues to take Topamax on a daily basis, he does not believe that this is helping much with appetite suppression. Denies any known side effects of taking the medication. No formal exercise routine  Follow vitamin D deficiency: He's taking 50,000 units on a weekly basis. Has not noticed any improvement in fatigue or mild depression that has been present ever since his father's passing one month ago. Fortunately he does believe that his grieving and mild depression is improving on a weekly basis.  Followup hypertriglyceridemia: Continues to take Vasepa on a daily basis without noted side effects. Denies right upper quadrant pain, epigastric pain, nor chest pain or limb claudication   followup asthma: Symptoms have been worsening over the last one-2 weeks described as nonproductive cough and mild wheezing. Symptoms are worse during high pollen count days. He's not been using albuterol. Denies shortness of breath or chest pain. Symptoms are accompanied by nasal congestion, watery eyes, itchy eyes. Continues to take Zyrtec daily  Chronic back pain: Patient states back pain is well-controlled not interfere with quality of life while taking Percocet twice a day. Denies any new change in severity, character, nor frequency of pain     Review Of Systems Outlined In HPI  Past Medical History  Diagnosis Date  . Chronic gout 11/02/2011  . Depression 12/16/2011  . Diabetes mellitus type 2 in obese 11/02/2011  . Edema 12/16/2011  . Hx of adenomatous colonic polyps 11/02/2011  . Hyperlipidemia LDL goal <160 11/02/2011  . Hypogonadism male 11/02/2011  . Hypothyroidism 11/02/2011  . Obesity, Class II, BMI 35-39.9, with comorbidity 12/16/2011  . Right C6 radiculopathy 12/16/2011    No past surgical history on file. Family History  Problem Relation Age of Onset  . Stroke Mother     History   Social  History  . Marital Status: Married    Spouse Name: N/A    Number of Children: N/A  . Years of Education: N/A   Occupational History  . Not on file.   Social History Main Topics  . Smoking status: Never Smoker   . Smokeless tobacco: Not on file  . Alcohol Use: No  . Drug Use: No  . Sexual Activity: Not on file   Other Topics Concern  . Not on file   Social History Narrative  . No narrative on file     Objective: BP 136/89  Pulse 89  Wt 278 lb (126.1 kg)  General: Alert and Oriented, No Acute Distress HEENT: Pupils equal, round, reactive to light. Conjunctivae clear.  External ears unremarkable, canals clear with intact TMs with appropriate landmarks.  Middle ear appears open without effusion. Pink inferior turbinates.  Moist mucous membranes, pharynx without inflammation nor lesions however moderate post nasal drip.  Neck supple without palpable lymphadenopathy nor abnormal masses. Lungs: Clear to auscultation bilaterally, no wheezing/ronchi/rales.  Comfortable work of breathing. Good air movement. Cardiac: Regular rate and rhythm. Normal S1/S2.  No murmurs, rubs, nor gallops.   Abdomen: Obese soft nontender  Extremities: No peripheral edema.  Strong peripheral pulses.  Mental Status: No depression, anxiety, nor agitation. Skin: Warm and dry.  Assessment & Plan: [name] was seen today for 1 month f/u.  Diagnoses and associated orders for this visit:  Vitamin D deficiency - Vit D  25 hydroxy (rtn osteoporosis monitoring)  Morbid obesity  Hypertriglyceridemia - Lipid panel  Asthma, chronic -  albuterol (PROVENTIL HFA;VENTOLIN HFA) 108 (90 BASE) MCG/ACT inhaler; Inhale 2 puffs into the lungs every 6 (six) hours as needed for wheezing.  Chronic back pain - oxyCODONE-acetaminophen (PERCOCET) 7.5-325 MG per tablet; Take 1 tablet by mouth 2 (two) times daily.  Other Orders - montelukast (SINGULAIR) 10 MG tablet; Take 1 tablet (10 mg total) by mouth at  bedtime. - phentermine (ADIPEX-P) 37.5 MG tablet; Take 1 tablet (37.5 mg total) by mouth daily before breakfast.    Hypertriglyceridemia: Due for repeat lipid panel   vitamin D deficiency: Due for repeat vitamin D levels   obesity: Uncontrolled restart phentermine, discussed need to continuously lose weight on this medication with normotensive blood pressures in order to be provided a monthly basis Chronic back pain: Controlled continue as needed Percocet Asthma: Uncontrolled chronic condition start as needed albuterol and restart Singulair   Return in about 4 weeks (around 10/08/2013).

## 2013-09-12 ENCOUNTER — Telehealth: Payer: Self-pay | Admitting: Family Medicine

## 2013-09-12 DIAGNOSIS — E559 Vitamin D deficiency, unspecified: Secondary | ICD-10-CM

## 2013-09-12 LAB — LIPID PANEL
CHOLESTEROL: 169 mg/dL (ref 0–200)
HDL: 31 mg/dL — AB (ref >39)
TRIGLYCERIDES: 401 mg/dL — AB (ref <150)
Total CHOL/HDL Ratio: 5.5 Ratio

## 2013-09-12 LAB — VITAMIN D 25 HYDROXY (VIT D DEFICIENCY, FRACTURES): Vit D, 25-Hydroxy: 27 ng/mL — ABNORMAL LOW (ref 30–89)

## 2013-09-12 MED ORDER — VITAMIN D (ERGOCALCIFEROL) 1.25 MG (50000 UNIT) PO CAPS: 50000.0000 [IU] | ORAL_CAPSULE | ORAL | Status: DC

## 2013-09-12 NOTE — Telephone Encounter (Signed)
Sue Lushndrea, Will you please let patient know that his triglycerides have made a drastic improvement.  They're still elevated however should continue to improve with weight loss.  Continue on Vascepa Rx.  Vitamin D is slowly improving but still deficient so I'll send in a new 3 months supply of a weekly vitamin d supplement. We'll recheck both vitamin d and triglycerides in about 3 months.

## 2013-09-12 NOTE — Telephone Encounter (Signed)
Left message on pt's vm with results

## 2013-10-07 ENCOUNTER — Other Ambulatory Visit: Payer: Self-pay | Admitting: Family Medicine

## 2013-10-09 ENCOUNTER — Ambulatory Visit: Admitting: Family Medicine

## 2013-10-10 ENCOUNTER — Ambulatory Visit (INDEPENDENT_AMBULATORY_CARE_PROVIDER_SITE_OTHER): Admitting: Family Medicine

## 2013-10-10 ENCOUNTER — Encounter: Payer: Self-pay | Admitting: Family Medicine

## 2013-10-10 VITALS — BP 112/74 | HR 81 | Wt 267.0 lb

## 2013-10-10 DIAGNOSIS — G8929 Other chronic pain: Secondary | ICD-10-CM

## 2013-10-10 DIAGNOSIS — E1169 Type 2 diabetes mellitus with other specified complication: Secondary | ICD-10-CM

## 2013-10-10 DIAGNOSIS — M542 Cervicalgia: Secondary | ICD-10-CM

## 2013-10-10 DIAGNOSIS — M549 Dorsalgia, unspecified: Secondary | ICD-10-CM

## 2013-10-10 DIAGNOSIS — E669 Obesity, unspecified: Secondary | ICD-10-CM

## 2013-10-10 DIAGNOSIS — R5383 Other fatigue: Secondary | ICD-10-CM

## 2013-10-10 DIAGNOSIS — E785 Hyperlipidemia, unspecified: Secondary | ICD-10-CM

## 2013-10-10 DIAGNOSIS — R5381 Other malaise: Secondary | ICD-10-CM

## 2013-10-10 DIAGNOSIS — E119 Type 2 diabetes mellitus without complications: Secondary | ICD-10-CM

## 2013-10-10 MED ORDER — COLESEVELAM HCL 625 MG PO TABS: 1875.0000 mg | ORAL_TABLET | Freq: Two times a day (BID) | ORAL | Status: DC

## 2013-10-10 MED ORDER — PHENTERMINE HCL 37.5 MG PO TABS: 37.5000 mg | ORAL_TABLET | Freq: Every day | ORAL | Status: DC

## 2013-10-10 MED ORDER — OXYCODONE-ACETAMINOPHEN 7.5-325 MG PO TABS: 1.0000 | ORAL_TABLET | Freq: Two times a day (BID) | ORAL | Status: DC

## 2013-10-10 NOTE — Progress Notes (Signed)
CC: Jeremiah Long is a 38 y.o. male is here for wt and BP check   Subjective: HPI:  Followup chronic back pain: Continues to localize pain at the base of the cervical spine. Currently there is no radiation. Denies any change in the severity, frequency, nor character of the pain. He believes it's well-controlled his current Percocet regimen.  Denies any motor or sensory disturbances in the upper extremity  Followup obesity: Since I saw him last he started on phentermine without any known side effects. He has lost about 10 pounds. He is not sure whether or not something with portion control. No change in exercise routine . Followup type 2 diabetes with hyperlipidemia: Continues to take atorvastatin, vascepa, metformin and fenofibrate on a daily basis. No known side effects of the above. A1c a few months ago was under control. Denies polyuria polyphagia or polydipsia.  His major complaint today is extreme fatigue has been present for 2 weeks has not been getting worse or better but is present on a daily basis. Slightly present in the morning however by mid afternoon is to a degree where he has to take 1/2 hour nap. He reports getting 6-7 hours of sleep a night, no snoring, sleep study last year was normal, he denies depression.  Denies shortness of breath, chest pain, nor weakness.   Review Of Systems Outlined In HPI  Past Medical History  Diagnosis Date  . Chronic gout 11/02/2011  . Depression 12/16/2011  . Diabetes mellitus type 2 in obese 11/02/2011  . Edema 12/16/2011  . Hx of adenomatous colonic polyps 11/02/2011  . Hyperlipidemia LDL goal <160 11/02/2011  . Hypogonadism male 11/02/2011  . Hypothyroidism 11/02/2011  . Obesity, Class II, BMI 35-39.9, with comorbidity 12/16/2011  . Right C6 radiculopathy 12/16/2011    No past surgical history on file. Family History  Problem Relation Age of Onset  . Stroke Mother     History   Social History  . Marital Status: Married    Spouse Name:  N/A    Number of Children: N/A  . Years of Education: N/A   Occupational History  . Not on file.   Social History Main Topics  . Smoking status: Never Smoker   . Smokeless tobacco: Not on file  . Alcohol Use: No  . Drug Use: No  . Sexual Activity: Not on file   Other Topics Concern  . Not on file   Social History Narrative  . No narrative on file     Objective: BP 112/74  Pulse 81  Wt 267 lb (121.11 kg)  General: Alert and Oriented, No Acute Distress HEENT: Pupils equal, round, reactive to light. Conjunctivae clear.  Moist mucous membranes pharynx unremarkable Lungs: Clear to auscultation bilaterally, no wheezing/ronchi/rales.  Comfortable work of breathing. Good air movement. Cardiac: Regular rate and rhythm. Normal S1/S2.  No murmurs, rubs, nor gallops.   Abdomen: Obese soft nontender Extremities: No peripheral edema.  Strong peripheral pulses.  Mental Status: No depression, anxiety, nor agitation. Skin: Warm and dry.  Assessment & Plan: Jeremiah Long was seen today for wt and bp check.  Diagnoses and associated orders for this visit:  Chronic back pain - oxyCODONE-acetaminophen (PERCOCET) 7.5-325 MG per tablet; Take 1 tablet by mouth 2 (two) times daily.  Morbid obesity - phentermine (ADIPEX-P) 37.5 MG tablet; Take 1 tablet (37.5 mg total) by mouth daily before breakfast.  Right C6 radiculopathy  Fatigue - TSH - T4, free - T3, free  Diabetes mellitus type 2  in obese - colesevelam (WELCHOL) 625 MG tablet; Take 3 tablets (1,875 mg total) by mouth 2 (two) times daily with a meal.  Hyperlipidemia LDL goal <160 - colesevelam (WELCHOL) 625 MG tablet; Take 3 tablets (1,875 mg total) by mouth 2 (two) times daily with a meal.    Chronic back pain with right C6 radiculopathy.  No radicular symptoms currently and pain is well controlled on Percocet Morbid obesity: Improving on phentermine continue current dose Fatigue: Check a TSH and thyroid hormones, he does have  vitamin D deficiency but symptoms came on in the setting of known and improving deficiency Type 2 diabetes: Well controlled continue metformin and WelChol  Or in the process of getting uloric approved through his insurance company, he's about to run out of this so he was given 21 doses of 80 mg tablets in samples  Return in about 4 weeks (around 11/07/2013).

## 2013-10-11 ENCOUNTER — Telehealth: Payer: Self-pay | Admitting: Family Medicine

## 2013-10-11 DIAGNOSIS — E039 Hypothyroidism, unspecified: Secondary | ICD-10-CM

## 2013-10-11 LAB — TSH: TSH: 0.331 u[IU]/mL — ABNORMAL LOW (ref 0.350–4.500)

## 2013-10-11 LAB — T3, FREE: T3, Free: 2.9 pg/mL (ref 2.3–4.2)

## 2013-10-11 LAB — T4, FREE: FREE T4: 1.85 ng/dL — AB (ref 0.80–1.80)

## 2013-10-11 MED ORDER — LEVOTHYROXINE SODIUM 137 MCG PO TABS: 137.0000 ug | ORAL_TABLET | Freq: Every day | ORAL | Status: DC

## 2013-10-11 NOTE — Telephone Encounter (Signed)
Sue Lush, Will you please let patient know that his thyroid replacement therapy with levothyroxine appears to be to high of a dose.  I've sent in a smaller dose of to his [facility] to replace the former .  We'll recheck this in three months.

## 2013-10-11 NOTE — Telephone Encounter (Signed)
Left message on vm

## 2013-10-12 ENCOUNTER — Telehealth: Payer: Self-pay | Admitting: *Deleted

## 2013-10-12 ENCOUNTER — Telehealth: Payer: Self-pay

## 2013-10-12 MED ORDER — ALLOPURINOL 300 MG PO TABS: 300.0000 mg | ORAL_TABLET | Freq: Every day | ORAL | Status: DC

## 2013-10-12 NOTE — Telephone Encounter (Signed)
[facility] on [address] states the Uloric and the Allopurinol has been approved through [employer] for a zero copay. Which would you like him to take?

## 2013-10-12 NOTE — Telephone Encounter (Signed)
Pt's insurance will not cover Uloric. Informed pt of this and told him you were sending a higher dose of Allopurinol to his pharmacy.  Meyer Cory, LPN

## 2013-10-15 NOTE — Telephone Encounter (Signed)
I'd prefer both, if the patient can confirm that Uloric is covered at the time he purchases it he only needs to take the 100mg  dose of allopurinol instead of the 300mg .  Please let me know what he finds out.

## 2013-10-16 NOTE — Telephone Encounter (Signed)
Pt notified and notified pharm

## 2013-10-18 ENCOUNTER — Telehealth: Payer: Self-pay | Admitting: *Deleted

## 2013-10-18 NOTE — Telephone Encounter (Signed)
Pt calls wanting to know exactly what medication he is suppose to be taking.  Right now he has the 80mg  of Uloric, 100mg  & 300mg  of allopurinol.  I myself got a little confused in the phone calls.

## 2013-10-19 NOTE — Telephone Encounter (Signed)
Pt advised . Bonnie Turci CMA  

## 2013-10-19 NOTE — Telephone Encounter (Signed)
Uloric 80mg  and Allopurinol 300mg  daily.

## 2013-11-06 ENCOUNTER — Encounter: Payer: Self-pay | Admitting: Family Medicine

## 2013-11-06 ENCOUNTER — Ambulatory Visit (INDEPENDENT_AMBULATORY_CARE_PROVIDER_SITE_OTHER): Admitting: Family Medicine

## 2013-11-06 DIAGNOSIS — K5903 Drug induced constipation: Secondary | ICD-10-CM

## 2013-11-06 DIAGNOSIS — M549 Dorsalgia, unspecified: Secondary | ICD-10-CM

## 2013-11-06 DIAGNOSIS — K5909 Other constipation: Secondary | ICD-10-CM

## 2013-11-06 DIAGNOSIS — M542 Cervicalgia: Secondary | ICD-10-CM

## 2013-11-06 DIAGNOSIS — T402X5A Adverse effect of other opioids, initial encounter: Secondary | ICD-10-CM

## 2013-11-06 DIAGNOSIS — G8929 Other chronic pain: Secondary | ICD-10-CM

## 2013-11-06 DIAGNOSIS — R609 Edema, unspecified: Secondary | ICD-10-CM

## 2013-11-06 MED ORDER — POTASSIUM CHLORIDE CRYS ER 20 MEQ PO TBCR: 20.0000 meq | EXTENDED_RELEASE_TABLET | Freq: Two times a day (BID) | ORAL | Status: DC

## 2013-11-06 MED ORDER — ATORVASTATIN CALCIUM 80 MG PO TABS: 80.0000 mg | ORAL_TABLET | Freq: Every day | ORAL | Status: DC

## 2013-11-06 MED ORDER — LINACLOTIDE 145 MCG PO CAPS: 145.0000 ug | ORAL_CAPSULE | Freq: Every day | ORAL | Status: DC

## 2013-11-06 MED ORDER — PHENTERMINE HCL 37.5 MG PO TABS: 37.5000 mg | ORAL_TABLET | Freq: Every day | ORAL | Status: DC

## 2013-11-06 MED ORDER — OXYCODONE-ACETAMINOPHEN 7.5-325 MG PO TABS: 1.0000 | ORAL_TABLET | Freq: Two times a day (BID) | ORAL | Status: DC

## 2013-11-06 NOTE — Progress Notes (Signed)
CC: Jeremiah Long is a 38 y.o. male is here for wt and Bp check   Subjective: HPI:  Followup obesity: Continues on phentermine and Topamax. Denies known side effects. Denies anxiety, restlessness, rapid heartbeat, difficulty sleeping. States that regimen is still helping with appetite suppression. No formal exercise routine but stays active at work  Right C6 radiculopathy: Continues on oxycodone twice a day. Provided he takes this regimen he denies any radicular symptoms nor posterior neck pain. Denies any new motor or sensory disturbances in the right or left upper extremity. Denies midline neck pain.  Followup edema: He is requesting a refill on potassium, he only takes this on days that he takes his Lasix. His need for this has decreased to less than most days of the week due to profuse sweating on the days that he is at work which prevents edema.  Review of systems he does endorse constipation described as having bowel movements only every 3-4 days.  He needs refills on atorvastatin  Review Of Systems Outlined In HPI  Past Medical History  Diagnosis Date  . Chronic gout 11/02/2011  . Depression 12/16/2011  . Diabetes mellitus type 2 in obese 11/02/2011  . Edema 12/16/2011  . Hx of adenomatous colonic polyps 11/02/2011  . Hyperlipidemia LDL goal <160 11/02/2011  . Hypogonadism male 11/02/2011  . Hypothyroidism 11/02/2011  . Obesity, Class II, BMI 35-39.9, with comorbidity 12/16/2011  . Right C6 radiculopathy 12/16/2011    No past surgical history on file. Family History  Problem Relation Age of Onset  . Stroke Mother     History   Social History  . Marital Status: Married    Spouse Name: N/A    Number of Children: N/A  . Years of Education: N/A   Occupational History  . Not on file.   Social History Main Topics  . Smoking status: Never Smoker   . Smokeless tobacco: Not on file  . Alcohol Use: No  . Drug Use: No  . Sexual Activity: Not on file   Other Topics Concern  .  Not on file   Social History Narrative  . No narrative on file     Objective: BP 128/88  Pulse 82  Wt 262 lb (118.842 kg)  General: Alert and Oriented, No Acute Distress HEENT: Pupils equal, round, reactive to light. Conjunctivae clear.  Moist mucous membranes pharynx unremarkable Lungs: Clear to auscultation bilaterally, no wheezing/ronchi/rales.  Comfortable work of breathing. Good air movement. Cardiac: Regular rate and rhythm. Normal S1/S2.  No murmurs, rubs, nor gallops.   Extremities: No peripheral edema.  Strong peripheral pulses. Full range of motion and strength in both upper extremities, no midline cervical spine tenderness Mental Status: No depression, anxiety, nor agitation. Skin: Warm and dry.  Assessment & Plan: Jeremiah Long was seen today for wt and bp check.  Diagnoses and associated orders for this visit:  Morbid obesity - phentermine (ADIPEX-P) 37.5 MG tablet; Take 1 tablet (37.5 mg total) by mouth daily before breakfast.  Right C6 radiculopathy - oxyCODONE-acetaminophen (PERCOCET) 7.5-325 MG per tablet; Take 1 tablet by mouth 2 (two) times daily.  Edema - potassium chloride SA (K-DUR,KLOR-CON) 20 MEQ tablet; Take 1 tablet (20 mEq total) by mouth 2 (two) times daily.  Chronic back pain - oxyCODONE-acetaminophen (PERCOCET) 7.5-325 MG per tablet; Take 1 tablet by mouth 2 (two) times daily.  Constipation due to opioid therapy - Linaclotide (LINZESS) 145 MCG CAPS capsule; Take 1 capsule (145 mcg total) by mouth daily.  Other  Orders - atorvastatin (LIPITOR) 80 MG tablet; Take 1 tablet (80 mg total) by mouth daily.    obesity: Improving continue phentermine and Topamax Right C6 radiculopathy: Stable and controlled continue oxycodone Edema: Stable and controlled continue as needed Lasix with potassium on the days that he takes Lasix Constipation: Most likely due to a combination of chronic opiate therapy and dehydration due to wearing body armor in almost 100  weather lately, 2 weeks worth of Linzess provided via samples, I've asked him to call me if he would like longer prescription based on his response  Return in about 4 weeks (around 12/04/2013) for Blood pressure and weight check.

## 2013-12-02 ENCOUNTER — Other Ambulatory Visit: Payer: Self-pay | Admitting: Family Medicine

## 2013-12-05 ENCOUNTER — Encounter: Payer: Self-pay | Admitting: Family Medicine

## 2013-12-05 ENCOUNTER — Ambulatory Visit (INDEPENDENT_AMBULATORY_CARE_PROVIDER_SITE_OTHER): Admitting: Family Medicine

## 2013-12-05 VITALS — BP 114/78 | HR 81 | Ht 71.0 in | Wt 260.0 lb

## 2013-12-05 DIAGNOSIS — E669 Obesity, unspecified: Secondary | ICD-10-CM

## 2013-12-05 DIAGNOSIS — E291 Testicular hypofunction: Secondary | ICD-10-CM

## 2013-12-05 DIAGNOSIS — M542 Cervicalgia: Secondary | ICD-10-CM

## 2013-12-05 DIAGNOSIS — E039 Hypothyroidism, unspecified: Secondary | ICD-10-CM

## 2013-12-05 DIAGNOSIS — Z79899 Other long term (current) drug therapy: Secondary | ICD-10-CM

## 2013-12-05 DIAGNOSIS — E119 Type 2 diabetes mellitus without complications: Secondary | ICD-10-CM

## 2013-12-05 DIAGNOSIS — E1169 Type 2 diabetes mellitus with other specified complication: Secondary | ICD-10-CM

## 2013-12-05 DIAGNOSIS — M549 Dorsalgia, unspecified: Secondary | ICD-10-CM

## 2013-12-05 DIAGNOSIS — G8929 Other chronic pain: Secondary | ICD-10-CM

## 2013-12-05 MED ORDER — LEVOTHYROXINE SODIUM 137 MCG PO TABS: 137.0000 ug | ORAL_TABLET | Freq: Every day | ORAL | Status: DC

## 2013-12-05 MED ORDER — OXYCODONE-ACETAMINOPHEN 7.5-325 MG PO TABS: 1.0000 | ORAL_TABLET | Freq: Two times a day (BID) | ORAL | Status: DC

## 2013-12-05 MED ORDER — PHENTERMINE HCL 37.5 MG PO TABS: 37.5000 mg | ORAL_TABLET | Freq: Every day | ORAL | Status: DC

## 2013-12-05 NOTE — Progress Notes (Signed)
CC: Jeremiah Long is a 38 y.o. male is here for Bp and Wt check   Subjective: HPI:  Followup hyperthyroidism: Continues on levothyroxine 137 mcg on a daily basis without missed doses. TSH was suppressed and this was checked a few months ago. Since then he denies unintentional weight loss or gain, diarrhea, skin changes, hair changes.  Followup hypogonadism: Continues to use testosterone every 21 days. States it has helped his energy level and denies any known side effects  Followup obesity: Continues on phentermine on a daily basis without known side effects. He started a herba- life diet at sounds like it based on protein shakes meal replacements. No current exercise routine.  Type 2 diabetes: He continues to control this only with diet and lifestyle modifications. Denies polyuria polyphagia polydipsia nor poorly healing wounds  Follow chronic back pain with right C6 radiculopathy: Symptoms are absent provided he takes Percocet twice a day. No change in the character severity or presentation of his posterior neck pain with radiation into the right upper arm. Denies motor or sensory disturbances  Review Of Systems Outlined In HPI  Past Medical History  Diagnosis Date  . Chronic gout 11/02/2011  . Depression 12/16/2011  . Diabetes mellitus type 2 in obese 11/02/2011  . Edema 12/16/2011  . Hx of adenomatous colonic polyps 11/02/2011  . Hyperlipidemia LDL goal <160 11/02/2011  . Hypogonadism male 11/02/2011  . Hypothyroidism 11/02/2011  . Obesity, Class II, BMI 35-39.9, with comorbidity 12/16/2011  . Right C6 radiculopathy 12/16/2011    No past surgical history on file. Family History  Problem Relation Age of Onset  . Stroke Mother     History   Social History  . Marital Status: Married    Spouse Name: N/A    Number of Children: N/A  . Years of Education: N/A   Occupational History  . Not on file.   Social History Main Topics  . Smoking status: Never Smoker   . Smokeless  tobacco: Not on file  . Alcohol Use: No  . Drug Use: No  . Sexual Activity: Not on file   Other Topics Concern  . Not on file   Social History Narrative  . No narrative on file     Objective: BP 114/78  Pulse 81  Ht 5\' 11"  (1.803 m)  Wt 260 lb (117.935 kg)  BMI 36.28 kg/m2  General: Alert and Oriented, No Acute Distress HEENT: Pupils equal, round, reactive to light. Conjunctivae clear.  Moist because membranes pharynx unremarkable Lungs: Clear to auscultation bilaterally, no wheezing/ronchi/rales.  Comfortable work of breathing. Good air movement. Cardiac: Regular rate and rhythm. Normal S1/S2.  No murmurs, rubs, nor gallops.   Abdomen: Obese soft nontender Extremities: No peripheral edema.  Strong peripheral pulses.  Mental Status: No depression, anxiety, nor agitation. Skin: Warm and dry.  Assessment & Plan: [name] was seen today for bp and wt check.  Diagnoses and associated orders for this visit:  Hypothyroidism, unspecified hypothyroidism type - levothyroxine (SYNTHROID, LEVOTHROID) 137 MCG tablet; Take 1 tablet (137 mcg total) by mouth daily. - TSH  Hypogonadism male - PSA - Hemoglobin  Morbid obesity - phentermine (ADIPEX-P) 37.5 MG tablet; Take 1 tablet (37.5 mg total) by mouth daily before breakfast.  High risk medication use - PSA - Hemoglobin  Diabetes mellitus type 2 in obese - Hemoglobin A1c  Chronic back pain - oxyCODONE-acetaminophen (PERCOCET) 7.5-325 MG per tablet; Take 1 tablet by mouth 2 (two) times daily.  Right C6 radiculopathy -  oxyCODONE-acetaminophen (PERCOCET) 7.5-325 MG per tablet; Take 1 tablet by mouth 2 (two) times daily.    Hypothyroidism: Clinically controlled to do for TSH continue levothyroxine 137 mcg on a daily basis pending results Morbid obesity: Improving on phentermine refill provided, congratulated his new attempt at dieting with protein shake meal replacements High risk medication use with testosterone  supplementation in the setting of hypogonadism: He is due for PSA and hemoglobin check Chronic back pain: Controlled continue Percocet Type 2 diabetes: Clinically controlled due for A1c  Return if symptoms worsen or fail to improve.

## 2013-12-06 LAB — HEMOGLOBIN: HEMOGLOBIN: 14.9 g/dL (ref 13.0–17.0)

## 2013-12-06 LAB — TSH: TSH: 1.618 u[IU]/mL (ref 0.350–4.500)

## 2013-12-06 LAB — HEMOGLOBIN A1C
Hgb A1c MFr Bld: 5.8 % — ABNORMAL HIGH (ref <5.7)
MEAN PLASMA GLUCOSE: 120 mg/dL — AB (ref <117)

## 2013-12-06 LAB — PSA: PSA: 0.41 ng/mL (ref <=4.00)

## 2013-12-07 ENCOUNTER — Ambulatory Visit: Admitting: Family Medicine

## 2013-12-31 ENCOUNTER — Other Ambulatory Visit: Payer: Self-pay | Admitting: Family Medicine

## 2014-01-02 ENCOUNTER — Ambulatory Visit (INDEPENDENT_AMBULATORY_CARE_PROVIDER_SITE_OTHER): Admitting: Family Medicine

## 2014-01-02 ENCOUNTER — Encounter: Payer: Self-pay | Admitting: Family Medicine

## 2014-01-02 VITALS — BP 137/88 | HR 92 | Ht 71.0 in | Wt 265.0 lb

## 2014-01-02 DIAGNOSIS — E538 Deficiency of other specified B group vitamins: Secondary | ICD-10-CM

## 2014-01-02 DIAGNOSIS — M549 Dorsalgia, unspecified: Secondary | ICD-10-CM

## 2014-01-02 DIAGNOSIS — M1A00X0 Idiopathic chronic gout, unspecified site, without tophus (tophi): Secondary | ICD-10-CM

## 2014-01-02 DIAGNOSIS — M542 Cervicalgia: Secondary | ICD-10-CM

## 2014-01-02 DIAGNOSIS — M1A9XX0 Chronic gout, unspecified, without tophus (tophi): Secondary | ICD-10-CM

## 2014-01-02 DIAGNOSIS — L98491 Non-pressure chronic ulcer of skin of other sites limited to breakdown of skin: Secondary | ICD-10-CM

## 2014-01-02 DIAGNOSIS — L98499 Non-pressure chronic ulcer of skin of other sites with unspecified severity: Secondary | ICD-10-CM

## 2014-01-02 DIAGNOSIS — G8929 Other chronic pain: Secondary | ICD-10-CM

## 2014-01-02 DIAGNOSIS — T85848A Pain due to other internal prosthetic devices, implants and grafts, initial encounter: Secondary | ICD-10-CM

## 2014-01-02 DIAGNOSIS — E559 Vitamin D deficiency, unspecified: Secondary | ICD-10-CM

## 2014-01-02 MED ORDER — PHENTERMINE HCL 37.5 MG PO TABS: 37.5000 mg | ORAL_TABLET | Freq: Every day | ORAL | Status: DC

## 2014-01-02 MED ORDER — OXYCODONE-ACETAMINOPHEN 7.5-325 MG PO TABS: 1.0000 | ORAL_TABLET | Freq: Two times a day (BID) | ORAL | Status: DC

## 2014-01-02 MED ORDER — SILVER SULFADIAZINE 1 % EX CREA: 1.0000 "application " | TOPICAL_CREAM | Freq: Every day | CUTANEOUS | Status: DC

## 2014-01-02 NOTE — Progress Notes (Signed)
CC: Jeremiah Long is a 38 y.o. male is here for 1 month f/u   Subjective: HPI:  Followup obesity: Patient states that he was taking phentermine for about a week and then he they're misplaced his phentermine or his ex-wife stole it. He's having difficulty with keeping weight off due to the stress of addiction problems that he reports his wife is currently suffering from.  He complains of dental pain in the right lower posterior molar that is currently between a root canal and permanent cap placement. It is worse with chewing or with opening his jaw. He denies fevers, chills, discharge from the gum line, swollen lymph nodes nor nausea.  Complains of an ulceration on the back of his right thigh that has been present for the past one or 2 weeks. No benefit from Neosporin. He's also been putting alcohol and hydrogen peroxide on it a few times a day. It is slightly tender to the touch, does not itch, no discharge.  Requesting refill on oxycodone for chronic back pain with right C6 radiculopathy. Symptoms are currently moderate in severity and slightly worse since I saw him last because he has either misplaced his oxycodone prescription or his ex-wife stole it. He does not request refills early because he knew was unlikely that I would refill it.  Denies any new motor or sensory disturbances other than that described above. Denies weakness or tingling in the upper extremities  He would like me to go over all his medications with him to outline which medications are treating what certain conditions and to see if we can stop any medications.   Review Of Systems Outlined In HPI  Past Medical History  Diagnosis Date  . Chronic gout 11/02/2011  . Depression 12/16/2011  . Diabetes mellitus type 2 in obese 11/02/2011  . Edema 12/16/2011  . Hx of adenomatous colonic polyps 11/02/2011  . Hyperlipidemia LDL goal <160 11/02/2011  . Hypogonadism male 11/02/2011  . Hypothyroidism 11/02/2011  . Obesity, Class  II, BMI 35-39.9, with comorbidity 12/16/2011  . Right C6 radiculopathy 12/16/2011    No past surgical history on file. Family History  Problem Relation Age of Onset  . Stroke Mother     History   Social History  . Marital Status: Married    Spouse Name: N/A    Number of Children: N/A  . Years of Education: N/A   Occupational History  . Not on file.   Social History Main Topics  . Smoking status: Never Smoker   . Smokeless tobacco: Not on file  . Alcohol Use: No  . Drug Use: No  . Sexual Activity: Not on file   Other Topics Concern  . Not on file   Social History Narrative  . No narrative on file     Objective: BP 137/88  Pulse 92  Ht 5\' 11"  (1.803 m)  Wt 265 lb (120.203 kg)  BMI 36.98 kg/m2  General: Alert and Oriented, No Acute Distress HEENT: Pupils equal, round, reactive to light. Conjunctivae clear.  External ears unremarkable, canals clear with intact TMs with appropriate landmarks.  Middle ear appears open without effusion. Pink inferior turbinates.  Moist mucous membranes, pharynx without inflammation nor lesions.  Neck supple without palpable lymphadenopathy nor abnormal masses. The right inferior posterior two molars are tender to the touch when percussed with a tongue depressor but there is no gingival tenderness or discharge. Lungs: Clear to auscultation bilaterally, no wheezing/ronchi/rales.  Comfortable work of breathing. Good air movement. Cardiac:  Regular rate and rhythm. Normal S1/S2.  No murmurs, rubs, nor gallops.   Abdomen: Obese and soft Extremities: No peripheral edema.  Strong peripheral pulses.  Mental Status: No depression, anxiety, nor agitation. Skin: Warm and dry. Shallow 1 cm ulceration on the posterior right thigh slightly tender to touch without surrounding erythema or induration  Assessment & Plan: Lucille was seen today for 1 month f/u.  Diagnoses and associated orders for this visit:  Vitamin D deficiency - Vit D  25 hydroxy (rtn  osteoporosis monitoring)  Dental implant pain, initial encounter  Skin ulcer, limited to breakdown of skin  Chronic back pain - oxyCODONE-acetaminophen (PERCOCET) 7.5-325 MG per tablet; Take 1 tablet by mouth 2 (two) times daily.  Right C6 radiculopathy - oxyCODONE-acetaminophen (PERCOCET) 7.5-325 MG per tablet; Take 1 tablet by mouth 2 (two) times daily.  Morbid obesity - phentermine (ADIPEX-P) 37.5 MG tablet; Take 1 tablet (37.5 mg total) by mouth daily before breakfast.  Vitamin B 12 deficiency - Vitamin B12  Chronic gout without tophus, unspecified cause, unspecified site  Other Orders - silver sulfADIAZINE (SILVADENE) 1 % cream; Apply 1 application topically daily. For two weeks.    Vitamin D deficiency: He just finished 3 months of vitamin D replacement, checking vitamin D level to see if we need to continue another 3 months Dental implant pain: Reassurance provided no signs of infection today and follow up with dentistry for final cap Skin ulceration: Stop hydrogen peroxide and alcohol cleaninging, begin using silver sulfadiazine and cover with a Band-Aid replace daily for the next week Chronic back pain with C6 radiculopathy: Stable when on oxycodone, refilled Morbid obesity: Uncontrolled, restart phentermine, encouraged to keep controlled substances for from his ex-wife Vitamin B12 deficiency: He is currently not on any therapy, checking B12 to see if he needs to restart this Gout: Controlled, stopping culture seen continue allopurinol andUloric  Time was taken to go over all of his medications, other than stopping colchicine I've encouraged him to continue on all other medications  40 minutes spent face-to-face during visit today of which at least 50% was counseling or coordinating care regarding: 1. Vitamin D deficiency   2. Dental implant pain, initial encounter   3. Skin ulcer, limited to breakdown of skin   4. Chronic back pain   5. Right C6 radiculopathy   6.  Morbid obesity   7. Vitamin B 12 deficiency   8. 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

## 2014-01-03 LAB — VITAMIN D 25 HYDROXY (VIT D DEFICIENCY, FRACTURES): Vit D, 25-Hydroxy: 31 ng/mL (ref 30–89)

## 2014-01-03 LAB — VITAMIN B12: Vitamin B-12: 525 pg/mL (ref 211–911)

## 2014-02-01 ENCOUNTER — Ambulatory Visit: Admitting: Family Medicine

## 2014-02-01 ENCOUNTER — Telehealth: Payer: Self-pay | Admitting: Family Medicine

## 2014-02-01 DIAGNOSIS — M542 Cervicalgia: Secondary | ICD-10-CM

## 2014-02-01 DIAGNOSIS — M549 Dorsalgia, unspecified: Principal | ICD-10-CM

## 2014-02-01 DIAGNOSIS — G8929 Other chronic pain: Secondary | ICD-10-CM

## 2014-02-01 MED ORDER — OXYCODONE-ACETAMINOPHEN 7.5-325 MG PO TABS: 1.0000 | ORAL_TABLET | Freq: Two times a day (BID) | ORAL | Status: DC

## 2014-02-01 NOTE — Telephone Encounter (Signed)
Late for visit today.  Offered nurse visit if he's interested in possibly continuing phentermine, refill of Percocet regardless.

## 2014-02-05 ENCOUNTER — Ambulatory Visit: Admitting: Family Medicine

## 2014-02-06 ENCOUNTER — Ambulatory Visit (INDEPENDENT_AMBULATORY_CARE_PROVIDER_SITE_OTHER): Admitting: Family Medicine

## 2014-02-06 ENCOUNTER — Encounter: Payer: Self-pay | Admitting: Family Medicine

## 2014-02-06 VITALS — BP 126/82 | HR 86 | Wt 261.0 lb

## 2014-02-06 DIAGNOSIS — F32A Depression, unspecified: Secondary | ICD-10-CM

## 2014-02-06 DIAGNOSIS — E039 Hypothyroidism, unspecified: Secondary | ICD-10-CM

## 2014-02-06 DIAGNOSIS — F3289 Other specified depressive episodes: Secondary | ICD-10-CM

## 2014-02-06 DIAGNOSIS — F329 Major depressive disorder, single episode, unspecified: Secondary | ICD-10-CM

## 2014-02-06 DIAGNOSIS — K625 Hemorrhage of anus and rectum: Secondary | ICD-10-CM

## 2014-02-06 DIAGNOSIS — R609 Edema, unspecified: Secondary | ICD-10-CM

## 2014-02-06 MED ORDER — VENLAFAXINE HCL ER 75 MG PO CP24: 75.0000 mg | ORAL_CAPSULE | Freq: Every day | ORAL | Status: DC

## 2014-02-06 MED ORDER — LEVOTHYROXINE SODIUM 137 MCG PO TABS: 137.0000 ug | ORAL_TABLET | Freq: Every day | ORAL | Status: DC

## 2014-02-06 MED ORDER — PHENTERMINE HCL 37.5 MG PO TABS: 37.5000 mg | ORAL_TABLET | Freq: Every day | ORAL | Status: DC

## 2014-02-06 MED ORDER — FUROSEMIDE 40 MG PO TABS: 40.0000 mg | ORAL_TABLET | Freq: Every day | ORAL | Status: DC | PRN

## 2014-02-06 NOTE — Progress Notes (Signed)
CC: Jeremiah Long is a 38 y.o. male is here for Follow-up   Subjective: HPI:  Patient complains of worsening lack of interest in all hobbies, not wanting to leave the house due to excessive boredom, increasing length of sleep, and dissatisfaction with life all of which is moderate in severity but has not been accompanied by any thoughts of going to harm himself or others. Symptoms have been present for the last 2-3 months and worsening on a monthly basis. Symptoms are worsened by the recent separation of him and his wife along with a death of his father.  There's been no substance abuse or alcohol use contributing to the symptoms. Nothing else seems to make it better or worse. He's had similar symptoms in the remote past about 2 or 3 years ago. Denies anxiety or an element disturbance  Complains of rectal bleeding described as blood being found on his toilet paper that has been present for the past 2-3 weeks. It occurs sporadically less than most days of the week. He has a bowel movement about every 2 days. Symptoms are worse the more he strains the bleeding is painless. He denies any rectal swelling or masses. He's tried MiraLax but there was no benefit. An issue a few months ago identical to this which was resolved within one day after starting linzess he did not want to take this on a daily basis and it has returned.  Followup obesity: He reports success with appetite suppression and subjective weight loss since starting phentermine without any anxiety or difficulty sleeping. He's asking for refills  He is requesting a refill levothyroxine and Lasix   Review Of Systems Outlined In HPI  Past Medical History  Diagnosis Date  . Chronic gout 11/02/2011  . Depression 12/16/2011  . Diabetes mellitus type 2 in obese 11/02/2011  . Edema 12/16/2011  . Hx of adenomatous colonic polyps 11/02/2011  . Hyperlipidemia LDL goal <160 11/02/2011  . Hypogonadism male 11/02/2011  . Hypothyroidism 11/02/2011   . Obesity, Class II, BMI 35-39.9, with comorbidity 12/16/2011  . Right C6 radiculopathy 12/16/2011    No past surgical history on file. Family History  Problem Relation Age of Onset  . Stroke Mother     History   Social History  . Marital Status: Married    Spouse Name: N/A    Number of Children: N/A  . Years of Education: N/A   Occupational History  . Not on file.   Social History Main Topics  . Smoking status: Never Smoker   . Smokeless tobacco: Not on file  . Alcohol Use: No  . Drug Use: No  . Sexual Activity: Not on file   Other Topics Concern  . Not on file   Social History Narrative  . No narrative on file     Objective: BP 126/82  Pulse 86  Wt 261 lb (118.389 kg)  General: Alert and Oriented, No Acute Distress HEENT: Pupils equal, round, reactive to light. Conjunctivae clear.   moist mucous membranes pharynx unremarkable  Lungs: Clear to auscultation bilaterally, no wheezing/ronchi/rales.  Comfortable work of breathing. Good air movement. Cardiac: Regular rate and rhythm. Normal S1/S2.  No murmurs, rubs, nor gallops.   Extremities: No peripheral edema.  Strong peripheral pulses.  Mental Status:  mild depression without anxiety or agitation  Skin: Warm and dry.  Assessment & Plan: Jeremiah Long was seen today for follow-up.  Diagnoses and associated orders for this visit:  Depression - venlafaxine XR (EFFEXOR XR) 75 MG  24 hr capsule; Take 1 capsule (75 mg total) by mouth daily with breakfast.  Rectal bleeding  Morbid obesity - phentermine (ADIPEX-P) 37.5 MG tablet; Take 1 tablet (37.5 mg total) by mouth daily before breakfast.  Hypothyroidism, unspecified hypothyroidism type - levothyroxine (SYNTHROID, LEVOTHROID) 137 MCG tablet; Take 1 tablet (137 mcg total) by mouth daily.  Edema - furosemide (LASIX) 40 MG tablet; Take 1-2 tablets (40-80 mg total) by mouth daily as needed.    Depression: Start venlafaxine, stop immediately for thoughts of wanting  to harm self or others. Obesity: Improving on phentermine continue for another month, encouraged to increase exercise routine to also help with depression Rectal bleeding: Trial of psyllium powder due to a very high suspicion of internal hemorrhoids with chronic opiate use Hypothyroidism: Refilling levothyroxine Edema: Continue as needed Lasix  Return in about 4 weeks (around 03/06/2014) for Mood Recheck.

## 2014-02-06 NOTE — Patient Instructions (Signed)
Psyllium powder 1-2 heaping teaspoons consumed daily to prevent rectal bleeding.  If bleeding continues after two weeks let me know and we'll get you back in to your [facility].

## 2014-02-15 ENCOUNTER — Other Ambulatory Visit: Payer: Self-pay

## 2014-02-15 DIAGNOSIS — E781 Pure hyperglyceridemia: Secondary | ICD-10-CM

## 2014-02-15 MED ORDER — ICOSAPENT ETHYL 1 G PO CAPS: 2.0000 | ORAL_CAPSULE | Freq: Two times a day (BID) | ORAL | Status: DC

## 2014-03-04 ENCOUNTER — Ambulatory Visit (INDEPENDENT_AMBULATORY_CARE_PROVIDER_SITE_OTHER): Payer: Self-pay | Admitting: Family Medicine

## 2014-03-04 ENCOUNTER — Encounter: Payer: Self-pay | Admitting: Family Medicine

## 2014-03-04 VITALS — BP 115/78 | HR 98 | Wt 257.0 lb

## 2014-03-04 DIAGNOSIS — G8929 Other chronic pain: Secondary | ICD-10-CM

## 2014-03-04 DIAGNOSIS — F329 Major depressive disorder, single episode, unspecified: Secondary | ICD-10-CM

## 2014-03-04 DIAGNOSIS — M549 Dorsalgia, unspecified: Secondary | ICD-10-CM

## 2014-03-04 DIAGNOSIS — F32A Depression, unspecified: Secondary | ICD-10-CM

## 2014-03-04 DIAGNOSIS — IMO0001 Reserved for inherently not codable concepts without codable children: Secondary | ICD-10-CM

## 2014-03-04 DIAGNOSIS — M542 Cervicalgia: Secondary | ICD-10-CM

## 2014-03-04 DIAGNOSIS — Z23 Encounter for immunization: Secondary | ICD-10-CM

## 2014-03-04 DIAGNOSIS — K219 Gastro-esophageal reflux disease without esophagitis: Secondary | ICD-10-CM

## 2014-03-04 MED ORDER — OMEPRAZOLE 40 MG PO CPDR: 40.0000 mg | DELAYED_RELEASE_CAPSULE | Freq: Every day | ORAL | Status: DC

## 2014-03-04 MED ORDER — PHENTERMINE HCL 37.5 MG PO TABS: 37.5000 mg | ORAL_TABLET | Freq: Every day | ORAL | Status: DC

## 2014-03-04 MED ORDER — OXYCODONE-ACETAMINOPHEN 7.5-325 MG PO TABS: 1.0000 | ORAL_TABLET | Freq: Two times a day (BID) | ORAL | Status: DC

## 2014-03-04 MED ORDER — ESCITALOPRAM OXALATE 10 MG PO TABS: 10.0000 mg | ORAL_TABLET | Freq: Every day | ORAL | Status: DC

## 2014-03-04 NOTE — Progress Notes (Signed)
CC: Jeremiah Long is a 38 y.o. male is here for Follow-up   Subjective: HPI:  Follow obesity: Continues take phentermine on a daily basis without known side effects. He has continue to help him with portion control. He's been putting in more effort with trying to eat healthfully and avoiding empty calories.  Follow depression: Since starting Effexor on a daily basis he has had no change in his lack of interest in all hobbies. He believes the medication is also causing him to have mild photophobia and bright environments and has been causing him fatigue. He denies thoughts or harming self or others anxiety nor any other mental disturbance  Follow GERD: Requesting refills on omeprazole. He ran out of this medication in September and within a few days at a return of epigastric burning sensation. He restarted omeprazole in October and has 100% resolution of these symptoms without any other GI disturbance.  Followup back pain: He wants to know whether or not he's a candidate for physical therapy to help with this upper back pain localized in the posterior cervical region that radiates into the right arm. It is worse when his shoulders are tense.  Slightly improved with hydrocodone. Character severity and frequency has not changed over the past 3 months. He denies any recent trauma. Other than above denies any motor sensory disturbances in the upper extremities    Review Of Systems Outlined In HPI  Past Medical History  Diagnosis Date  . Chronic gout 11/02/2011  . Depression 12/16/2011  . Diabetes mellitus type 2 in obese 11/02/2011  . Edema 12/16/2011  . Hx of adenomatous colonic polyps 11/02/2011  . Hyperlipidemia LDL goal <160 11/02/2011  . Hypogonadism male 11/02/2011  . Hypothyroidism 11/02/2011  . Obesity, Class II, BMI 35-39.9, with comorbidity 12/16/2011  . Right C6 radiculopathy 12/16/2011    No past surgical history on file. Family History  Problem Relation Age of Onset  . Stroke  Mother     History   Social History  . Marital Status: Married    Spouse Name: N/A    Number of Children: N/A  . Years of Education: N/A   Occupational History  . Not on file.   Social History Main Topics  . Smoking status: Never Smoker   . Smokeless tobacco: Not on file  . Alcohol Use: No  . Drug Use: No  . Sexual Activity: Not on file   Other Topics Concern  . Not on file   Social History Narrative  . No narrative on file     Objective: BP 115/78  Pulse 98  Wt 257 lb (116.574 kg)   General: Alert and Oriented, No Acute Distress HEENT: Pupils equal, round, reactive to light. Conjunctivae clear.  External ears unremarkable, canals clear with intact TMs with moist membranes pharynx unremarkable Lungs: Clear to auscultation bilaterally, no wheezing/ronchi/rales.  Comfortable work of breathing. Good air movement. Cardiac: Regular rate and rhythm. Normal S1/S2.  No murmurs, rubs, nor gallops.   Abdomen: Obese soft nontender Extremities: No peripheral edema.  Strong peripheral pulses.  Mental Status: No depression, anxiety, nor agitation. Skin: Warm and dry.  Assessment & Plan: Jeremiah Long was seen today for follow-up.  Diagnoses and associated orders for this visit:  Obesity, Class II, BMI 35-39.9, with comorbidity  Depression - escitalopram (LEXAPRO) 10 MG tablet; Take 1 tablet (10 mg total) by mouth daily.  Gastroesophageal reflux disease without esophagitis - omeprazole (PRILOSEC) 40 MG capsule; Take 1 capsule (40 mg total) by mouth  daily.  Chronic back pain - oxyCODONE-acetaminophen (PERCOCET) 7.5-325 MG per tablet; Take 1 tablet by mouth 2 (two) times daily. - Ambulatory referral to Physical Therapy  Right C6 radiculopathy - oxyCODONE-acetaminophen (PERCOCET) 7.5-325 MG per tablet; Take 1 tablet by mouth 2 (two) times daily. - Ambulatory referral to Physical Therapy  Morbid obesity - phentermine (ADIPEX-P) 37.5 MG tablet; Take 1 tablet (37.5 mg total) by  mouth daily before breakfast.    Obesity: Improving continue phentermine at dietary interventions Depression: Uncontrolled, side effects from Effexor therefore stop immediately and switch to Lexapro GERD: Uncontrolled while off of omeprazole but now controlled since he returned on his former dose. Continue to take 40 mg daily Chronic back pain and C6 radiculopathy: Controlled continue as needed oxycodone, physical therapy referral has been placed   Return in about 4 weeks (around 04/01/2014).

## 2014-03-07 ENCOUNTER — Other Ambulatory Visit: Payer: Self-pay | Admitting: Family Medicine

## 2014-03-26 ENCOUNTER — Encounter: Payer: Self-pay | Admitting: Family Medicine

## 2014-03-26 ENCOUNTER — Ambulatory Visit (INDEPENDENT_AMBULATORY_CARE_PROVIDER_SITE_OTHER): Payer: Self-pay | Admitting: Family Medicine

## 2014-03-26 VITALS — BP 117/80 | HR 82 | Wt 259.0 lb

## 2014-03-26 DIAGNOSIS — M62838 Other muscle spasm: Secondary | ICD-10-CM

## 2014-03-26 DIAGNOSIS — M542 Cervicalgia: Secondary | ICD-10-CM

## 2014-03-26 DIAGNOSIS — F32A Depression, unspecified: Secondary | ICD-10-CM

## 2014-03-26 DIAGNOSIS — G8929 Other chronic pain: Secondary | ICD-10-CM

## 2014-03-26 DIAGNOSIS — F329 Major depressive disorder, single episode, unspecified: Secondary | ICD-10-CM

## 2014-03-26 DIAGNOSIS — R5382 Chronic fatigue, unspecified: Secondary | ICD-10-CM

## 2014-03-26 MED ORDER — DICLOFENAC SODIUM 50 MG PO TBEC: DELAYED_RELEASE_TABLET | ORAL | Status: DC

## 2014-03-26 MED ORDER — HYDROCODONE-ACETAMINOPHEN 5-325 MG PO TABS: ORAL_TABLET | ORAL | Status: DC

## 2014-03-26 MED ORDER — DIAZEPAM 5 MG PO TABS: ORAL_TABLET | ORAL | Status: DC

## 2014-03-26 NOTE — Progress Notes (Signed)
CC: Jeremiah Long is a 38 y.o. male is here for ER f/u   Subjective: HPI:  Follow-up depression: 2 weeks after starting Lexapro he awoke with a sense of panic, nervousness, right hand tremor and spasm, and rapid heartbeat. He was seen at a local emergency room where a CT scan of the head and chest x-ray were normal. CBC was normal, urine drug screen normal, metabolic panel significant only for mildly elevated liver enzymes and blood sugar. He was given lorazepam but instead has been taking diltiazem for anxiety at home fueled by divorce proceedings. He stopped taking Lexapro today that this occurred on October 30. He's had no panic attacks or similar symptoms to that on the 30th since he stopped Lexapro. He reports mild depression but no thoughts of going to harm himself or others.  Follow-up chronic neck pain: He expresses interest in stopping taking opiates. He has stopped taking Percocet and instead has been taking 5 mg of hydrocodone twice a day at home which is providing mild relief of his chronic neck pain. Diazepam seems to help with her separate tightness component. He continues to describe pain as pain and stiffness which currently is nonraised.  Complains of chronic fatigue over the past 2-3 months. He denies any sleep disturbance and reports feeling overall rested after sleeping. He denies apneic episodes. There's been no shortness of breath, chest pain, nor focal weakness   Review Of Systems Outlined In HPI  Past Medical History  Diagnosis Date  . Chronic gout 11/02/2011  . Depression 12/16/2011  . Diabetes mellitus type 2 in obese 11/02/2011  . Edema 12/16/2011  . Hx of adenomatous colonic polyps 11/02/2011  . Hyperlipidemia LDL goal <160 11/02/2011  . Hypogonadism male 11/02/2011  . Hypothyroidism 11/02/2011  . Obesity, Class II, BMI 35-39.9, with comorbidity 12/16/2011  . Right C6 radiculopathy 12/16/2011    No past surgical history on file. Family History  Problem Relation Age  of Onset  . Stroke Mother     History   Social History  . Marital Status: Married    Spouse Name: N/A    Number of Children: N/A  . Years of Education: N/A   Occupational History  . Not on file.   Social History Main Topics  . Smoking status: Never Smoker   . Smokeless tobacco: Not on file  . Alcohol Use: No  . Drug Use: No  . Sexual Activity: Not on file   Other Topics Concern  . Not on file   Social History Narrative     Objective: BP 117/80 mmHg  Pulse 82  Wt 259 lb (117.482 kg)  General: Alert and Oriented, No Acute Distress HEENT: Pupils equal, round, reactive to light. Conjunctivae clear.  Moistness member inspects unremarkable Lungs: Clear to auscultation bilaterally, no wheezing/ronchi/rales.  Comfortable work of breathing. Good air movement. Cardiac: Regular rate and rhythm. Normal S1/S2.  No murmurs, rubs, nor gallops.   Abdomen: obese and soft Extremities: No peripheral edema.  Strong peripheral pulses.  Mental Status: mild depression without anxiety or agitation Skin: Warm and dry.  Assessment & Plan: [name] was seen today for er f/u.  Diagnoses and associated orders for this visit:  Depression - B12 - Testosterone - Vit D  25 hydroxy (rtn osteoporosis monitoring)  Muscle spasm - diazepam (VALIUM) 5 MG tablet; One by mouth every night to help with pain/sleep, may take one additional dose in the daytime as needed for neck pain.  Chronic neck pain - HYDROcodone-acetaminophen (NORCO/VICODIN)  5-325 MG per tablet; Taper Program: One by mouth twice a day for five days, then half tab by mouth twice a day for five days, then half tab once a day for four days. - diclofenac (VOLTAREN) 50 MG EC tablet; Take one tablet every 8 hours only as needed for pain, take with small snack.  Chronic fatigue - B12 - Testosterone - Vit D  25 hydroxy (rtn osteoporosis monitoring)    Depression and fatigue: Checking B12 testosterone and vitamin D.joint agreement  to not start any antidepressant medication due to side effects to many of these medications in the past Chronic neck pain:discussed the taper regimen above using hydrocodone, continue as needed Valium for neck pain and begin diclofenac. He never had much benefit from meloxicam or Celebrex  25 minutes spent face-to-face during visit today of which at least 50% was counseling or coordinating care regarding: 1. Depression   2. Muscle spasm   3. Chronic neck pain   4. Chronic fatigue       Return in about 4 weeks (around 04/23/2014).

## 2014-03-27 ENCOUNTER — Telehealth: Payer: Self-pay | Admitting: Family Medicine

## 2014-03-27 DIAGNOSIS — E291 Testicular hypofunction: Secondary | ICD-10-CM

## 2014-03-27 LAB — VITAMIN D 25 HYDROXY (VIT D DEFICIENCY, FRACTURES): Vit D, 25-Hydroxy: 22 ng/mL — ABNORMAL LOW (ref 30–89)

## 2014-03-27 LAB — TESTOSTERONE: Testosterone: 166 ng/dL — ABNORMAL LOW (ref 300–890)

## 2014-03-27 LAB — VITAMIN B12: Vitamin B-12: 460 pg/mL (ref 211–911)

## 2014-03-27 MED ORDER — VITAMIN D (ERGOCALCIFEROL) 1.25 MG (50000 UNIT) PO CAPS: 50000.0000 [IU] | ORAL_CAPSULE | ORAL | Status: DC

## 2014-03-27 MED ORDER — TESTOSTERONE CYPIONATE 200 MG/ML IM SOLN: INTRAMUSCULAR | Status: DC

## 2014-03-27 NOTE — Telephone Encounter (Signed)
Pt notified and he would like to get the rx to give himself the injections

## 2014-03-27 NOTE — Telephone Encounter (Signed)
Jeremiah Long, Will you please let patient know that his vitamin D level was deficient again so I sent in another 3 month Rx of weekly vitamin d supplementation.   His testosterone remains in the low range, if he's interested in restarting supplementation I can either print off an Rx for injections at home or he has the option of having it done here every 3 weeks.

## 2014-03-27 NOTE — Telephone Encounter (Signed)
Jeremiah Long, Rx placed in in-box ready for pickup/faxing. Can you also print him an Rx of syringes and needles to last three months.  I'd prefer that he give himself the first injection here under the guidance of one of the nurses.

## 2014-03-28 MED ORDER — AMBULATORY NON FORMULARY MEDICATION: Status: DC

## 2014-04-01 ENCOUNTER — Ambulatory Visit: Admitting: Family Medicine

## 2014-04-15 ENCOUNTER — Telehealth: Payer: Self-pay | Admitting: *Deleted

## 2014-04-15 MED ORDER — OXYCODONE-ACETAMINOPHEN 7.5-325 MG PO TABS: 1.0000 | ORAL_TABLET | Freq: Two times a day (BID) | ORAL | Status: DC | PRN

## 2014-04-15 NOTE — Telephone Encounter (Signed)
Pt called and left a message stating that he needed a rx for oxycodone. He states the physical therapy is not helping. Per notes he was supposed to be on hydrocodone and tapering off.Please advise

## 2014-04-15 NOTE — Telephone Encounter (Signed)
(  see previous phone note; accidentally closed phone note) pt wants refill of oxycodone. He states Pt is not helping his pain. Per progress notes he was trying to taper off pain meds and was on hydrocodone and not oxycodone.Please advise

## 2014-04-15 NOTE — Telephone Encounter (Signed)
Sue Lushndrea, Rx placed in in-box ready for pickup/faxing. Going back to former percocet Rx regimen

## 2014-04-16 NOTE — Telephone Encounter (Signed)
Pt notified and rx up front 

## 2014-04-19 ENCOUNTER — Ambulatory Visit (INDEPENDENT_AMBULATORY_CARE_PROVIDER_SITE_OTHER): Admitting: Physical Therapy

## 2014-04-19 DIAGNOSIS — G8929 Other chronic pain: Secondary | ICD-10-CM

## 2014-04-19 DIAGNOSIS — M2550 Pain in unspecified joint: Secondary | ICD-10-CM

## 2014-04-19 DIAGNOSIS — M2560 Stiffness of unspecified joint, not elsewhere classified: Secondary | ICD-10-CM

## 2014-04-19 DIAGNOSIS — M549 Dorsalgia, unspecified: Secondary | ICD-10-CM

## 2014-04-19 DIAGNOSIS — M542 Cervicalgia: Secondary | ICD-10-CM

## 2014-04-23 ENCOUNTER — Ambulatory Visit: Admitting: Family Medicine

## 2014-04-24 ENCOUNTER — Encounter (INDEPENDENT_AMBULATORY_CARE_PROVIDER_SITE_OTHER): Admitting: Physical Therapy

## 2014-04-24 ENCOUNTER — Other Ambulatory Visit: Payer: Self-pay | Admitting: Family Medicine

## 2014-04-24 ENCOUNTER — Ambulatory Visit (INDEPENDENT_AMBULATORY_CARE_PROVIDER_SITE_OTHER): Admitting: Family Medicine

## 2014-04-24 ENCOUNTER — Encounter: Payer: Self-pay | Admitting: Family Medicine

## 2014-04-24 VITALS — BP 136/88 | HR 97 | Wt 252.0 lb

## 2014-04-24 DIAGNOSIS — G8929 Other chronic pain: Secondary | ICD-10-CM

## 2014-04-24 DIAGNOSIS — E781 Pure hyperglyceridemia: Secondary | ICD-10-CM

## 2014-04-24 DIAGNOSIS — M549 Dorsalgia, unspecified: Secondary | ICD-10-CM

## 2014-04-24 DIAGNOSIS — M542 Cervicalgia: Secondary | ICD-10-CM

## 2014-04-24 DIAGNOSIS — F32A Depression, unspecified: Secondary | ICD-10-CM

## 2014-04-24 DIAGNOSIS — M2550 Pain in unspecified joint: Secondary | ICD-10-CM

## 2014-04-24 DIAGNOSIS — M2560 Stiffness of unspecified joint, not elsewhere classified: Secondary | ICD-10-CM

## 2014-04-24 DIAGNOSIS — Z202 Contact with and (suspected) exposure to infections with a predominantly sexual mode of transmission: Secondary | ICD-10-CM

## 2014-04-24 DIAGNOSIS — F329 Major depressive disorder, single episode, unspecified: Secondary | ICD-10-CM

## 2014-04-24 MED ORDER — PHENTERMINE HCL 37.5 MG PO TABS: 37.5000 mg | ORAL_TABLET | Freq: Every day | ORAL | Status: DC
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
## 2014-04-25 LAB — LIPID PANEL
Cholesterol: 109 mg/dL (ref 0–200)
HDL: 36 mg/dL — ABNORMAL LOW (ref >39)
LDL CALC: 28 mg/dL (ref 0–99)
Total CHOL/HDL Ratio: 3 Ratio
Triglycerides: 226 mg/dL — ABNORMAL HIGH (ref <150)
VLDL: 45 mg/dL — ABNORMAL HIGH (ref 0–40)

## 2014-04-25 LAB — GC/CHLAMYDIA PROBE AMP, URINE
Chlamydia, Swab/Urine, PCR: NEGATIVE
GC PROBE AMP, URINE: NEGATIVE

## 2014-04-25 LAB — RPR

## 2014-04-25 LAB — HIV ANTIBODY (ROUTINE TESTING W REFLEX): HIV: NONREACTIVE

## 2014-04-25 LAB — HEPATITIS C ANTIBODY: HCV Ab: NEGATIVE

## 2014-04-26 ENCOUNTER — Encounter: Admitting: Physical Therapy

## 2014-04-29 ENCOUNTER — Encounter (INDEPENDENT_AMBULATORY_CARE_PROVIDER_SITE_OTHER): Admitting: Physical Therapy

## 2014-04-29 DIAGNOSIS — M2560 Stiffness of unspecified joint, not elsewhere classified: Secondary | ICD-10-CM

## 2014-04-29 DIAGNOSIS — M2550 Pain in unspecified joint: Secondary | ICD-10-CM

## 2014-04-29 DIAGNOSIS — G8929 Other chronic pain: Secondary | ICD-10-CM

## 2014-04-29 DIAGNOSIS — M542 Cervicalgia: Secondary | ICD-10-CM

## 2014-04-29 DIAGNOSIS — M549 Dorsalgia, unspecified: Secondary | ICD-10-CM

## 2014-05-01 ENCOUNTER — Encounter (INDEPENDENT_AMBULATORY_CARE_PROVIDER_SITE_OTHER): Admitting: Physical Therapy

## 2014-05-01 DIAGNOSIS — M549 Dorsalgia, unspecified: Secondary | ICD-10-CM

## 2014-05-01 DIAGNOSIS — G8929 Other chronic pain: Secondary | ICD-10-CM

## 2014-05-01 DIAGNOSIS — M2550 Pain in unspecified joint: Secondary | ICD-10-CM

## 2014-05-01 DIAGNOSIS — M542 Cervicalgia: Secondary | ICD-10-CM

## 2014-05-01 DIAGNOSIS — M2560 Stiffness of unspecified joint, not elsewhere classified: Secondary | ICD-10-CM

## 2014-05-06 ENCOUNTER — Encounter (INDEPENDENT_AMBULATORY_CARE_PROVIDER_SITE_OTHER): Admitting: Physical Therapy

## 2014-05-06 DIAGNOSIS — M542 Cervicalgia: Secondary | ICD-10-CM

## 2014-05-06 DIAGNOSIS — G8929 Other chronic pain: Secondary | ICD-10-CM

## 2014-05-06 DIAGNOSIS — M2550 Pain in unspecified joint: Secondary | ICD-10-CM

## 2014-05-06 DIAGNOSIS — M2560 Stiffness of unspecified joint, not elsewhere classified: Secondary | ICD-10-CM

## 2014-05-06 DIAGNOSIS — M549 Dorsalgia, unspecified: Secondary | ICD-10-CM

## 2014-05-08 ENCOUNTER — Encounter (INDEPENDENT_AMBULATORY_CARE_PROVIDER_SITE_OTHER): Admitting: Physical Therapy

## 2014-05-08 DIAGNOSIS — G8929 Other chronic pain: Secondary | ICD-10-CM

## 2014-05-08 DIAGNOSIS — M549 Dorsalgia, unspecified: Secondary | ICD-10-CM

## 2014-05-08 DIAGNOSIS — M2550 Pain in unspecified joint: Secondary | ICD-10-CM

## 2014-05-08 DIAGNOSIS — M2560 Stiffness of unspecified joint, not elsewhere classified: Secondary | ICD-10-CM

## 2014-05-08 DIAGNOSIS — M542 Cervicalgia: Secondary | ICD-10-CM

## 2014-05-12 ENCOUNTER — Other Ambulatory Visit: Payer: Self-pay | Admitting: Family Medicine

## 2014-05-13 ENCOUNTER — Encounter (INDEPENDENT_AMBULATORY_CARE_PROVIDER_SITE_OTHER): Admitting: Physical Therapy

## 2014-05-13 DIAGNOSIS — G8929 Other chronic pain: Secondary | ICD-10-CM

## 2014-05-13 DIAGNOSIS — M542 Cervicalgia: Secondary | ICD-10-CM

## 2014-05-13 DIAGNOSIS — M2560 Stiffness of unspecified joint, not elsewhere classified: Secondary | ICD-10-CM

## 2014-05-13 DIAGNOSIS — M549 Dorsalgia, unspecified: Secondary | ICD-10-CM

## 2014-05-13 DIAGNOSIS — M2550 Pain in unspecified joint: Secondary | ICD-10-CM

## 2014-05-15 ENCOUNTER — Encounter (INDEPENDENT_AMBULATORY_CARE_PROVIDER_SITE_OTHER): Admitting: Physical Therapy

## 2014-05-15 DIAGNOSIS — G8929 Other chronic pain: Secondary | ICD-10-CM

## 2014-05-15 DIAGNOSIS — M2560 Stiffness of unspecified joint, not elsewhere classified: Secondary | ICD-10-CM

## 2014-05-15 DIAGNOSIS — M2550 Pain in unspecified joint: Secondary | ICD-10-CM

## 2014-05-15 DIAGNOSIS — M549 Dorsalgia, unspecified: Secondary | ICD-10-CM

## 2014-05-20 ENCOUNTER — Encounter: Admitting: Physical Therapy

## 2014-05-24 ENCOUNTER — Encounter (INDEPENDENT_AMBULATORY_CARE_PROVIDER_SITE_OTHER): Admitting: Physical Therapy

## 2014-05-24 ENCOUNTER — Ambulatory Visit (INDEPENDENT_AMBULATORY_CARE_PROVIDER_SITE_OTHER): Admitting: Family Medicine

## 2014-05-24 ENCOUNTER — Encounter: Payer: Self-pay | Admitting: Family Medicine

## 2014-05-24 VITALS — BP 132/84 | HR 86 | Wt 262.0 lb

## 2014-05-24 DIAGNOSIS — IMO0001 Reserved for inherently not codable concepts without codable children: Secondary | ICD-10-CM

## 2014-05-24 DIAGNOSIS — M2560 Stiffness of unspecified joint, not elsewhere classified: Secondary | ICD-10-CM

## 2014-05-24 DIAGNOSIS — G8929 Other chronic pain: Secondary | ICD-10-CM

## 2014-05-24 DIAGNOSIS — E291 Testicular hypofunction: Secondary | ICD-10-CM

## 2014-05-24 DIAGNOSIS — M549 Dorsalgia, unspecified: Secondary | ICD-10-CM

## 2014-05-24 DIAGNOSIS — M542 Cervicalgia: Secondary | ICD-10-CM

## 2014-05-24 DIAGNOSIS — E1169 Type 2 diabetes mellitus with other specified complication: Secondary | ICD-10-CM

## 2014-05-24 DIAGNOSIS — E669 Obesity, unspecified: Secondary | ICD-10-CM

## 2014-05-24 DIAGNOSIS — E119 Type 2 diabetes mellitus without complications: Secondary | ICD-10-CM

## 2014-05-24 DIAGNOSIS — M2550 Pain in unspecified joint: Secondary | ICD-10-CM

## 2014-05-24 MED ORDER — OXYCODONE-ACETAMINOPHEN 7.5-325 MG PO TABS: 1.0000 | ORAL_TABLET | Freq: Two times a day (BID) | ORAL | Status: DC | PRN

## 2014-05-24 MED ORDER — TOPIRAMATE 50 MG PO TABS: ORAL_TABLET | ORAL | Status: DC

## 2014-05-24 MED ORDER — TESTOSTERONE CYPIONATE 200 MG/ML IM SOLN: INTRAMUSCULAR | Status: DC

## 2014-05-25 LAB — HEMOGLOBIN A1C
Hgb A1c MFr Bld: 6.1 % — ABNORMAL HIGH (ref <5.7)
Mean Plasma Glucose: 128 mg/dL — ABNORMAL HIGH (ref <117)

## 2014-05-26 NOTE — Progress Notes (Signed)
CC: Jeremiah Long is a 39 y.o. male is here for 1 month f/u   Subjective: HPI:   follow-up hypogonadism:  Continues to use 200 mg testosterone  Every 21 days.  For some reason his pharmacy is only giving him a 1mL vial every 21 days.  He has not noticed any improvement or worsening of his libido  Nor energy since I saw him last. He denies any known side effects or intolerance to the injections. There is been no increased acne or behavioral changes.   Follow-up type 2 diabetes: currently not on any antihyperglycemic medications. No formal exercise routine, no polyuria or polyphagia or polydipsia. Denies vision loss or poorly healing wounds.   follow-up obesity: he's been taking phentermine on a daily basis since I saw him last.  He states it's giving him some energy but not helping with portion control. He's gained weight since I saw him last.   Follow-up neck pain and right C6 radiculopathy. States that overall chronic neck pain has slightly improved with physical therapy. Additionally TENS unit is helping  If he uses it daily at home. He denies any new motor or sensory disturbances. Denies weakness in the upper extremities or  Numbness.  Review Of Systems Outlined In HPI  Past Medical History  Diagnosis Date  . Chronic gout 11/02/2011  . Depression 12/16/2011  . Diabetes mellitus type 2 in obese 11/02/2011  . Edema 12/16/2011  . Hx of adenomatous colonic polyps 11/02/2011  . Hyperlipidemia LDL goal <160 11/02/2011  . Hypogonadism male 11/02/2011  . Hypothyroidism 11/02/2011  . Obesity, Class II, BMI 35-39.9, with comorbidity 12/16/2011  . Right C6 radiculopathy 12/16/2011    No past surgical history on file. Family History  Problem Relation Age of Onset  . Stroke Mother     History   Social History  . Marital Status: Married    Spouse Name: N/A    Number of Children: N/A  . Years of Education: N/A   Occupational History  . Not on file.   Social History Main Topics  . Smoking  status: Never Smoker   . Smokeless tobacco: Not on file  . Alcohol Use: No  . Drug Use: No  . Sexual Activity: Not on file   Other Topics Concern  . Not on file   Social History Narrative     Objective: BP 132/84 mmHg  Pulse 86  Wt 262 lb (118.842 kg)  General: Alert and Oriented, No Acute Distress HEENT: Pupils equal, round, reactive to light. Conjunctivae clear.   Moist mucous membranes Lungs: Clear to auscultation bilaterally, no wheezing/ronchi/rales.  Comfortable work of breathing. Good air movement. Cardiac: Regular rate and rhythm. Normal S1/S2.  No murmurs, rubs, nor gallops.   Abdomen: obese and soft Extremities: No peripheral edema.  Strong peripheral pulses.  Mental Status: No depression, anxiety, nor agitation. Skin: Warm and dry.  Assessment & Plan: Jeremiah Long was seen today for 1 month f/u.  Diagnoses and associated orders for this visit:  Hypogonadism male - testosterone cypionate (DEPOTESTOTERONE CYPIONATE) 200 MG/ML injection; INJECT 1 ML EVERY 21 DAYS  Diabetes mellitus type 2 in obese - Hemoglobin A1c  Obesity, Class II, BMI 35-39.9, with comorbidity  Right C6 radiculopathy  Other Orders - oxyCODONE-acetaminophen (PERCOCET) 7.5-325 MG per tablet; Take 1 tablet by mouth 2 (two) times daily as needed (neck pain). - topiramate (TOPAMAX) 50 MG tablet; TAKE 1 TABLET BY MOUTH 2 TIMES DAILY.     hypergonadism: Refilling testosterone,  Clarifying to  his pharmacy that he should be receiving 10 mL of testosterone.  type 2 diabetes: Clinically controlled to for A1c  Obesity: uncontrolled,  No benefit from phentermine  Therefore switching back to Topamax  Chronic neck pain with radiculopathy: Controlled and improving continue as needed Percocet  Return in about 3 months (around 08/23/2014).

## 2014-05-31 ENCOUNTER — Encounter (INDEPENDENT_AMBULATORY_CARE_PROVIDER_SITE_OTHER): Admitting: Physical Therapy

## 2014-05-31 DIAGNOSIS — G8929 Other chronic pain: Secondary | ICD-10-CM

## 2014-05-31 DIAGNOSIS — M542 Cervicalgia: Secondary | ICD-10-CM

## 2014-05-31 DIAGNOSIS — M2560 Stiffness of unspecified joint, not elsewhere classified: Secondary | ICD-10-CM

## 2014-05-31 DIAGNOSIS — M549 Dorsalgia, unspecified: Secondary | ICD-10-CM

## 2014-05-31 DIAGNOSIS — M2550 Pain in unspecified joint: Secondary | ICD-10-CM

## 2014-06-07 ENCOUNTER — Encounter: Admitting: Physical Therapy

## 2014-06-14 ENCOUNTER — Encounter: Admitting: Physical Therapy

## 2014-06-18 ENCOUNTER — Ambulatory Visit (INDEPENDENT_AMBULATORY_CARE_PROVIDER_SITE_OTHER): Admitting: Family Medicine

## 2014-06-18 ENCOUNTER — Encounter: Payer: Self-pay | Admitting: Family Medicine

## 2014-06-18 VITALS — BP 119/81 | HR 78 | Temp 97.8°F | Wt 270.0 lb

## 2014-06-18 DIAGNOSIS — B9689 Other specified bacterial agents as the cause of diseases classified elsewhere: Secondary | ICD-10-CM

## 2014-06-18 DIAGNOSIS — J329 Chronic sinusitis, unspecified: Secondary | ICD-10-CM

## 2014-06-18 DIAGNOSIS — A499 Bacterial infection, unspecified: Secondary | ICD-10-CM

## 2014-06-18 MED ORDER — DOXYCYCLINE HYCLATE 100 MG PO TABS: 100.0000 mg | ORAL_TABLET | Freq: Two times a day (BID) | ORAL | Status: DC

## 2014-06-18 NOTE — Progress Notes (Signed)
CC: Jeremiah Long is a 39 y.o. male is here for Nasal Congestion   Subjective: HPI:  Facial pressure localized to the forehead centrally and laterally with nasal congestion and postnasal drip that has been present for the past 5 days worsening on a daily basis. Currently moderate to severe in severity.accompanied by cough over the past 2 days with posttussive emesis and thick sputum production. Denies blood in the sputum. Wheezing at night has been present. Symptoms are slightly improved with Mucinex cough syrup. Nothing else seems to make symptoms better or worse. Accompanied by fever of 102.0 over the weekend. Denies confusion, hearing loss, chest pain, blood in sputum.  Review Of Systems Outlined In HPI  Past Medical History  Diagnosis Date  . Chronic gout 11/02/2011  . Depression 12/16/2011  . Diabetes mellitus type 2 in obese 11/02/2011  . Edema 12/16/2011  . Hx of adenomatous colonic polyps 11/02/2011  . Hyperlipidemia LDL goal <160 11/02/2011  . Hypogonadism male 11/02/2011  . Hypothyroidism 11/02/2011  . Obesity, Class II, BMI 35-39.9, with comorbidity 12/16/2011  . Right C6 radiculopathy 12/16/2011    No past surgical history on file. Family History  Problem Relation Age of Onset  . Stroke Mother     History   Social History  . Marital Status: Married    Spouse Name: N/A    Number of Children: N/A  . Years of Education: N/A   Occupational History  . Not on file.   Social History Main Topics  . Smoking status: Never Smoker   . Smokeless tobacco: Not on file  . Alcohol Use: No  . Drug Use: No  . Sexual Activity: Not on file   Other Topics Concern  . Not on file   Social History Narrative     Objective: BP 119/81 mmHg  Pulse 78  Temp(Src) 97.8 F (36.6 C) (Oral)  Wt 270 lb (122.471 kg)  General: Alert and Oriented, No Acute Distress HEENT: Pupils equal, round, reactive to light. Conjunctivae clear.  External ears unremarkable, canals clear with intact TMs  with appropriate landmarks.  Middle ear appears open without effusion. Erythematous inferior turbinates with mild mucoid discharge..  Moist mucous membranes, pharynx without inflammation nor lesions other than postnasal drip.  Neck supple without palpable lymphadenopathy nor abnormal masses. Lungs: Clear to auscultation bilaterally, no wheezing/ronchi/rales.  Comfortable work of breathing. Good air movement. Extremities: No peripheral edema.  Strong peripheral pulses.  Mental Status: No depression, anxiety, nor agitation. Skin: Warm and dry.  Assessment & Plan: Jeremiah Long was seen today for nasal congestion.  Diagnoses and associated orders for this visit:  Bacterial sinusitis - doxycycline (VIBRA-TABS) 100 MG tablet; Take 1 tablet (100 mg total) by mouth 2 (two) times daily.    Bacterial sinusitis: Start doxycycline consider nasal saline washes continue Mucinex cough syrup. Vision was 20/20 today with glasses on.  Return if symptoms worsen or fail to improve.

## 2014-06-24 ENCOUNTER — Ambulatory Visit: Admitting: Family Medicine

## 2014-06-24 ENCOUNTER — Telehealth: Payer: Self-pay | Admitting: Family Medicine

## 2014-06-24 ENCOUNTER — Other Ambulatory Visit: Payer: Self-pay | Admitting: *Deleted

## 2014-06-24 DIAGNOSIS — B9689 Other specified bacterial agents as the cause of diseases classified elsewhere: Secondary | ICD-10-CM

## 2014-06-24 DIAGNOSIS — J329 Chronic sinusitis, unspecified: Principal | ICD-10-CM

## 2014-06-24 MED ORDER — OXYCODONE-ACETAMINOPHEN 7.5-325 MG PO TABS: 1.0000 | ORAL_TABLET | Freq: Two times a day (BID) | ORAL | Status: DC | PRN

## 2014-06-24 MED ORDER — LEVOFLOXACIN 500 MG PO TABS: 500.0000 mg | ORAL_TABLET | Freq: Every day | ORAL | Status: DC

## 2014-06-24 NOTE — Telephone Encounter (Signed)
Mr Jeremiah Long reports he still has cough, congestion and chills. He is taking Afrin and mucinex-D with little relief. I advised him to stop the Afrin because it shouldn't be taken longer than 3 days and 5 days at the most. He does want a new antibiotic sent in and a refill on the oxycodone.

## 2014-06-24 NOTE — Telephone Encounter (Signed)
Mr. Jeremiah Long appt was cancelled via the automated system and he was coming in for a controlled substance pain med and says he is still not feeling well and wanting an antibiotic. - cf

## 2014-06-24 NOTE — Telephone Encounter (Signed)
Walked in to tell that his symptoms from last week are not improving, [facility]

## 2014-07-22 ENCOUNTER — Encounter: Payer: Self-pay | Admitting: Family Medicine

## 2014-07-22 ENCOUNTER — Ambulatory Visit (INDEPENDENT_AMBULATORY_CARE_PROVIDER_SITE_OTHER): Admitting: Family Medicine

## 2014-07-22 VITALS — BP 127/82 | HR 86 | Wt 272.0 lb

## 2014-07-22 DIAGNOSIS — L732 Hidradenitis suppurativa: Secondary | ICD-10-CM

## 2014-07-22 DIAGNOSIS — M542 Cervicalgia: Secondary | ICD-10-CM

## 2014-07-22 DIAGNOSIS — L709 Acne, unspecified: Secondary | ICD-10-CM

## 2014-07-22 DIAGNOSIS — Z202 Contact with and (suspected) exposure to infections with a predominantly sexual mode of transmission: Secondary | ICD-10-CM

## 2014-07-22 DIAGNOSIS — G8929 Other chronic pain: Secondary | ICD-10-CM

## 2014-07-22 DIAGNOSIS — R0982 Postnasal drip: Secondary | ICD-10-CM

## 2014-07-22 MED ORDER — BECLOMETHASONE DIPROPIONATE 80 MCG/ACT NA AERS: INHALATION_SPRAY | NASAL | Status: DC

## 2014-07-22 MED ORDER — OXYCODONE-ACETAMINOPHEN 7.5-325 MG PO TABS: 1.0000 | ORAL_TABLET | Freq: Two times a day (BID) | ORAL | Status: DC | PRN

## 2014-07-22 NOTE — Progress Notes (Signed)
CC: Jeremiah Long is a 39 y.o. male is here for Follow-up   Subjective: HPI:   complaints of "swollen glands " on the back of the neck have been present for matter of years. Symptoms are mild at this time. In the past they've gotten to the point where they were severe enough to where it sounds like he had to get incision and drainage. He tells me that these glands will swell and then come to a head and usually they will begin draining  By themselves which begins to help the pain improve. Other than [name] or drainage nothing else particular makes them better or worse. He's had them in the armpit before but today only complains about the back of the neck.  He's concerned that he was exposed to an STD a few months ago and that the lab work that we did at one of his recent visits did not pick up on his exposure and possible contraction of an infection. He denies any known  Symptoms that are causing this concern. He specifically denies difficulty fighting infections, dysuria, penile discharge, or skin changes.  Complains of mucus drainage on the back of the throat that has been present ever since he recovered from a bacterial sinusitis last month. Symptoms are only present at night and when lying down. No benefit from Mucinex or over-the-counter cough and cold medication. Symptoms are bad enough where they're waking him up in the middle the night. Denies sneezing, cough, shortness of breath, wheezing.  Follow-up neck pain: Continues to take Percocet twice a day which helps alleviate his chronic deep neck pain for 12 hours at a time. No radiation of pain. No motor or sensory disturbances in the upper extremities   Review Of Systems Outlined In HPI  Past Medical History  Diagnosis Date  . Chronic gout 11/02/2011  . Depression 12/16/2011  . Diabetes mellitus type 2 in obese 11/02/2011  . Edema 12/16/2011  . Hx of adenomatous colonic polyps 11/02/2011  . Hyperlipidemia LDL goal <160 11/02/2011  .  Hypogonadism male 11/02/2011  . Hypothyroidism 11/02/2011  . Obesity, Class II, BMI 35-39.9, with comorbidity 12/16/2011  . Right C6 radiculopathy 12/16/2011    No past surgical history on file. Family History  Problem Relation Age of Onset  . Stroke Mother     History   Social History  . Marital Status: Married    Spouse Name: N/A  . Number of Children: N/A  . Years of Education: N/A   Occupational History  . Not on file.   Social History Main Topics  . Smoking status: Never Smoker   . Smokeless tobacco: Not on file  . Alcohol Use: No  . Drug Use: No  . Sexual Activity: Not on file   Other Topics Concern  . Not on file   Social History Narrative     Objective: BP 127/82 mmHg  Pulse 86  Wt 272 lb (123.378 kg)  General: Alert and Oriented, No Acute Distress HEENT: Pupils equal, round, reactive to light. Conjunctivae clear.  Moist mucous membranes pharynx unremarkable other than mild postnasal drip Lungs: clear comfortable work of breathing Cardiac: Regular rate and rhythm.  Extremities: No peripheral edema.  Strong peripheral pulses.  Mental Status: No depression, anxiety, nor agitation. Skin: Warm and dry. No palpable or visual abnormality on the posterior neck.  Assessment & Plan: Jeremiah Long was seen today for follow-up.  Diagnoses and all orders for this visit:  Acne, unspecified acne type  STD exposure  Orders: -     HIV antibody -     RPR -     GC/chlamydia probe amp, urine -     Hepatitis C antibody  Postnasal drip  Chronic neck pain Orders: -     oxyCODONE-acetaminophen (PERCOCET) 7.5-325 MG per tablet; Take 1 tablet by mouth 2 (two) times daily as needed (neck pain).  Hidradenitis suppurativa Orders: -     Ambulatory referral to Dermatology  Other orders -     Beclomethasone Dipropionate (QNASL) 80 MCG/ACT AERS; Two sprays each nostril daily.   STD exposure: Rechecking labs above Postnasal drip: Start qnasl, samples provided Chronic neck  pain: Controlled with Percocet. His description of his neck issues sounds something like hidradenitis suppurativa versus acne. He would like to have a referral to dermatology for further management, in the meantime begin salicylic acid soap washes of the back of the neck.   Return in about 3 months (around 10/22/2014) for Routine Follow Up.

## 2014-07-22 NOTE — Patient Instructions (Signed)
Salicylic Acid Soap on back of neck daily. (neutrogenia is one manufacturer of this product)

## 2014-07-23 LAB — HIV ANTIBODY (ROUTINE TESTING W REFLEX): HIV 1&2 Ab, 4th Generation: NONREACTIVE

## 2014-07-23 LAB — RPR

## 2014-07-23 LAB — GC/CHLAMYDIA PROBE AMP, URINE
Chlamydia, Swab/Urine, PCR: NEGATIVE
GC PROBE AMP, URINE: NEGATIVE

## 2014-07-23 LAB — HEPATITIS C ANTIBODY: HCV Ab: NEGATIVE

## 2014-07-31 ENCOUNTER — Other Ambulatory Visit: Payer: Self-pay | Admitting: Family Medicine

## 2014-08-06 ENCOUNTER — Other Ambulatory Visit: Payer: Self-pay | Admitting: Family Medicine

## 2014-08-07 ENCOUNTER — Encounter: Payer: Self-pay | Admitting: Family Medicine

## 2014-08-07 DIAGNOSIS — L730 Acne keloid: Secondary | ICD-10-CM | POA: Insufficient documentation

## 2014-08-08 ENCOUNTER — Telehealth: Payer: Self-pay | Admitting: Family Medicine

## 2014-08-08 DIAGNOSIS — L730 Acne keloid: Secondary | ICD-10-CM

## 2014-08-08 NOTE — Telephone Encounter (Signed)
Pt called. He has seen the Dermatologist and [facility] He will be seeing Dr. Kathie Dikeefranzo for consultation visit on 3/31. Please place referral.  Thank you

## 2014-08-12 NOTE — Telephone Encounter (Signed)
Referral placed.

## 2014-08-19 ENCOUNTER — Encounter: Payer: Self-pay | Admitting: Family Medicine

## 2014-08-19 ENCOUNTER — Ambulatory Visit (INDEPENDENT_AMBULATORY_CARE_PROVIDER_SITE_OTHER): Admitting: Family Medicine

## 2014-08-19 VITALS — BP 133/87 | HR 98 | Wt 267.0 lb

## 2014-08-19 DIAGNOSIS — G5601 Carpal tunnel syndrome, right upper limb: Secondary | ICD-10-CM

## 2014-08-19 DIAGNOSIS — E291 Testicular hypofunction: Secondary | ICD-10-CM

## 2014-08-19 DIAGNOSIS — J302 Other seasonal allergic rhinitis: Secondary | ICD-10-CM

## 2014-08-19 DIAGNOSIS — M542 Cervicalgia: Secondary | ICD-10-CM

## 2014-08-19 DIAGNOSIS — G5603 Carpal tunnel syndrome, bilateral upper limbs: Secondary | ICD-10-CM

## 2014-08-19 DIAGNOSIS — J4520 Mild intermittent asthma, uncomplicated: Secondary | ICD-10-CM

## 2014-08-19 DIAGNOSIS — G8929 Other chronic pain: Secondary | ICD-10-CM

## 2014-08-19 DIAGNOSIS — E669 Obesity, unspecified: Secondary | ICD-10-CM

## 2014-08-19 DIAGNOSIS — G5602 Carpal tunnel syndrome, left upper limb: Secondary | ICD-10-CM

## 2014-08-19 MED ORDER — PHENTERMINE HCL 37.5 MG PO TABS: 37.5000 mg | ORAL_TABLET | Freq: Every day | ORAL | Status: DC

## 2014-08-19 MED ORDER — ALBUTEROL SULFATE HFA 108 (90 BASE) MCG/ACT IN AERS: 2.0000 | INHALATION_SPRAY | Freq: Four times a day (QID) | RESPIRATORY_TRACT | Status: DC | PRN

## 2014-08-19 MED ORDER — OXYCODONE-ACETAMINOPHEN 7.5-325 MG PO TABS: 1.0000 | ORAL_TABLET | Freq: Two times a day (BID) | ORAL | Status: DC | PRN

## 2014-08-19 MED ORDER — FLUTICASONE PROPIONATE 50 MCG/ACT NA SUSP: NASAL | Status: DC

## 2014-08-19 NOTE — Progress Notes (Signed)
CC: Jeremiah Long is a 39 y.o. male is here for Follow-up   Subjective: HPI:   Follow-up hypogonadism: Continues to take testosterone however admits that he is not taking it exactly every 3 weeks it's more like every 3-4 weeks but at a dose of 200 mg each injection. He denies any known side effects or intolerance. He does not believe that his energy level has improved that much since starting this regimen.  Follow-up neck pain: Continues to get benefit from Percocet taken twice a day. Provided he takes this twice a day he denies any midline pain or radiation of his pain.  Complains of nasal congestion with postnasal drip that has been present for the past 2 or 3 weeks on a daily basis. Over-the-counter antihistamines are not helping much. Denies fevers, chills, shortness of breath, wheezing, chest pain or headache.  Requesting refills on albuterol. He tells me his for his asthma that only seems to be bothersome during the summer. Currently he denies any shortness of breath wheezing or chest tightness but his old prescription is about to expire  Follow-up obesity: 5 pound weight loss since I saw him last continues to take Topamax daily. He is about to embark on a major lifestyle change with diet exercise and wants to know if he can start phentermine again to help with this.  Complains of bilateral hand pain described as a numbness that's present first thing in the morning when waking up. It improves if he shakes his hand out. It's also worse when he is trying to open up a jar or opening a bag of chips. He denies any known weakness. Symptoms of an present for matter of months mild in severity other than above nothing seems to make better or worse  Review Of Systems Outlined In HPI  Past Medical History  Diagnosis Date  . Chronic gout 11/02/2011  . Depression 12/16/2011  . Diabetes mellitus type 2 in obese 11/02/2011  . Edema 12/16/2011  . Hx of adenomatous colonic polyps 11/02/2011  .  Hyperlipidemia LDL goal <160 11/02/2011  . Hypogonadism male 11/02/2011  . Hypothyroidism 11/02/2011  . Obesity, Class II, BMI 35-39.9, with comorbidity 12/16/2011  . Right C6 radiculopathy 12/16/2011    No past surgical history on file. Family History  Problem Relation Age of Onset  . Stroke Mother     History   Social History  . Marital Status: Married    Spouse Name: N/A  . Number of Children: N/A  . Years of Education: N/A   Occupational History  . Not on file.   Social History Main Topics  . Smoking status: Never Smoker   . Smokeless tobacco: Not on file  . Alcohol Use: No  . Drug Use: No  . Sexual Activity: Not on file   Other Topics Concern  . Not on file   Social History Narrative     Objective: BP 133/87 mmHg  Pulse 98  Wt 267 lb (121.11 kg)  General: Alert and Oriented, No Acute Distress HEENT: Pupils equal, round, reactive to light. Conjunctivae clear.  External ears unremarkable, canals clear with intact TMs with appropriate landmarks.  Middle ear appears open without effusion. Pink inferior turbinates.  Moist mucous membranes, pharynx without inflammation nor lesions.  Neck supple without palpable lymphadenopathy nor abnormal masses. Lungs: Clear to auscultation bilaterally, no wheezing/ronchi/rales.  Comfortable work of breathing. Good air movement. Cardiac: Regular rate and rhythm. Normal S1/S2.  No murmurs, rubs, nor gallops.   Extremities: No  peripheral edema.  Strong peripheral pulses. Full range of motion strength in both hands including the wrists. Tinel positive on the right Mental Status: No depression, anxiety, nor agitation. Skin: Warm and dry.  Assessment & Plan: Jeremiah Long was seen today for follow-up.  Diagnoses and all orders for this visit:  Hypogonadism male Orders: -     Testosterone  Chronic neck pain Orders: -     oxyCODONE-acetaminophen (PERCOCET) 7.5-325 MG per tablet; Take 1 tablet by mouth 2 (two) times daily as needed (neck  pain).  Seasonal allergies Orders: -     fluticasone (FLONASE) 50 MCG/ACT nasal spray; Two sprays nostril daily.  Asthma, chronic, mild intermittent, uncomplicated Orders: -     albuterol (PROVENTIL HFA;VENTOLIN HFA) 108 (90 BASE) MCG/ACT inhaler; Inhale 2 puffs into the lungs every 6 (six) hours as needed for wheezing.  Obesity  Bilateral carpal tunnel syndrome  Other orders -     phentermine (ADIPEX-P) 37.5 MG tablet; Take 1 tablet (37.5 mg total) by mouth daily before breakfast.   Hypogonadism: Encouraged him tostart testosterone as prescribed getting it injection every 3 weeks specifically 21 days apart. If he can do this for the next month that I like to get a testosterone level with PSA and hemoglobin. With his erratic injection schedule I don't think that his testosterone level would be truly reflective of and near steady state value therefore no labs today Chronic neck pain: Controlled with as needed Percocet Seasonal allergies: Uncontrolled start Flonase Asthma: Controlled with as needed albuterol Obesity: Continue Topamax and adding phentermine to help maximize weight loss with his new diet and exercise interventions, provided that it is not losing weight on this medication he cannot have refills of phentermine  40 minutes spent face-to-face during visit today of which at least 50% was counseling or coordinating care regarding: 1. Hypogonadism male   2. Chronic neck pain   3. Seasonal allergies   4. Asthma, chronic, mild intermittent, uncomplicated   5. Obesity   6. Bilateral carpal tunnel syndrome      Return in about 4 weeks (around 09/16/2014).

## 2014-09-15 ENCOUNTER — Other Ambulatory Visit: Payer: Self-pay | Admitting: Family Medicine

## 2014-09-18 ENCOUNTER — Encounter: Payer: Self-pay | Admitting: Family Medicine

## 2014-09-18 ENCOUNTER — Ambulatory Visit (INDEPENDENT_AMBULATORY_CARE_PROVIDER_SITE_OTHER): Admitting: Family Medicine

## 2014-09-18 VITALS — BP 117/80 | HR 67 | Wt 267.0 lb

## 2014-09-18 DIAGNOSIS — R5383 Other fatigue: Secondary | ICD-10-CM

## 2014-09-18 DIAGNOSIS — R768 Other specified abnormal immunological findings in serum: Secondary | ICD-10-CM | POA: Diagnosis not present

## 2014-09-18 DIAGNOSIS — M542 Cervicalgia: Secondary | ICD-10-CM

## 2014-09-18 DIAGNOSIS — G8929 Other chronic pain: Secondary | ICD-10-CM | POA: Diagnosis not present

## 2014-09-18 MED ORDER — OXYCODONE-ACETAMINOPHEN 7.5-325 MG PO TABS: 1.0000 | ORAL_TABLET | Freq: Two times a day (BID) | ORAL | Status: DC | PRN

## 2014-09-18 NOTE — Progress Notes (Signed)
CC: Jeremiah Long is a 39 y.o. male is here for Follow-up   Subjective: HPI:  Complains of fatigue, increased severity of chronic joint pain and nonrestorative sleep that's been going on for the last 2 or 3 weeks. He tells me symptoms are similar to that which she was experiencing before being treated for Lyme's disease. Review of records shows that in 2012 he was found to have positive Lyme serology and a positive Western blot he then tells me that he was on a cycle of 3 different antibiotics that would be switched every 3 days for an unknown amount of time and when a Western blot was checked well after this treatment in 2014 the Western blot was negative. He denies fevers, chills, nausea, abdominal pain, new back pain. He denies any new rash. Denies any confusion or mental disturbance.  Requesting refills on Percocet for chronic cervical neck pain. Is currently nonradiating but seems to be a little bit more intense since multiple joints in his body have been more sensitive. He denies any new motor or sensory disturbances other than that described above. Denies any radiation of his pain is mostly posterior and base of the neck. Reports great relief with current Percocet regimen   Review Of Systems Outlined In HPI  Past Medical History  Diagnosis Date  . Chronic gout 11/02/2011  . Depression 12/16/2011  . Diabetes mellitus type 2 in obese 11/02/2011  . Edema 12/16/2011  . Hx of adenomatous colonic polyps 11/02/2011  . Hyperlipidemia LDL goal <160 11/02/2011  . Hypogonadism male 11/02/2011  . Hypothyroidism 11/02/2011  . Obesity, Class II, BMI 35-39.9, with comorbidity 12/16/2011  . Right C6 radiculopathy 12/16/2011    No past surgical history on file. Family History  Problem Relation Age of Onset  . Stroke Mother     History   Social History  . Marital Status: Married    Spouse Name: N/A  . Number of Children: N/A  . Years of Education: N/A   Occupational History  . Not on file.    Social History Main Topics  . Smoking status: Never Smoker   . Smokeless tobacco: Not on file  . Alcohol Use: No  . Drug Use: No  . Sexual Activity: Not on file   Other Topics Concern  . Not on file   Social History Narrative     Objective: BP 117/80 mmHg  Pulse 67  Wt 267 lb (121.11 kg)  General: Alert and Oriented, No Acute Distress HEENT: Pupils equal, round, reactive to light. Conjunctivae clear. Moist mucous membranes Lungs: Clear to auscultation bilaterally, no wheezing/ronchi/rales.  Comfortable work of breathing. Good air movement. Cardiac: Regular rate and rhythm. Normal S1/S2.  No murmurs, rubs, nor gallops.   Extremities: No peripheral edema.  Strong peripheral pulses.  Mental Status: No depression, anxiety, nor agitation. Skin: Warm and dry.  Assessment & Plan: Jeremiah Long was seen today for follow-up.  Diagnoses and all orders for this visit:  Positive Lyme disease serology Orders: -     B. Burgdorfi Antibodies by WB  Other fatigue Orders: -     Vitamin B12  Chronic neck pain Orders: -     oxyCODONE-acetaminophen (PERCOCET) 7.5-325 MG per tablet; Take 1 tablet by mouth 2 (two) times daily as needed (neck pain).   Fatigue: Rule out B12 deficiency that he has had in the past. Checking Western blot see if Lyme disease has returned. Chronic neck pain: Currently well controlled on Percocet.   Return in about  1 month (around 10/19/2014) for A1c, Testosterone, PSA, Hgb test.

## 2014-09-19 LAB — VITAMIN B12: Vitamin B-12: 987 pg/mL — ABNORMAL HIGH (ref 211–911)

## 2014-09-20 LAB — B. BURGDORFI ANTIBODIES BY WB
B burgdorferi IgG Abs (IB): NEGATIVE
B burgdorferi IgM Abs (IB): NEGATIVE

## 2014-10-02 ENCOUNTER — Telehealth: Payer: Self-pay

## 2014-10-02 DIAGNOSIS — E039 Hypothyroidism, unspecified: Secondary | ICD-10-CM

## 2014-10-02 NOTE — Telephone Encounter (Signed)
Patient called; he complains of being tired, gaining weight, trouble sleeping, poor circulation in hands, and difficult bowel movements. I scheduled him a follow up visit, per last lab note. He wanted to know if he could have his TSH checked today.

## 2014-10-02 NOTE — Telephone Encounter (Signed)
Patient advised.

## 2014-10-02 NOTE — Telephone Encounter (Signed)
Sure, lab slip in Andrea's inbox.

## 2014-10-03 LAB — TSH: TSH: 1.926 u[IU]/mL (ref 0.350–4.500)

## 2014-10-07 ENCOUNTER — Encounter: Payer: Self-pay | Admitting: Family Medicine

## 2014-10-07 ENCOUNTER — Ambulatory Visit (INDEPENDENT_AMBULATORY_CARE_PROVIDER_SITE_OTHER): Admitting: Family Medicine

## 2014-10-07 VITALS — BP 131/92 | HR 88 | Wt 264.0 lb

## 2014-10-07 DIAGNOSIS — E669 Obesity, unspecified: Secondary | ICD-10-CM | POA: Diagnosis not present

## 2014-10-07 DIAGNOSIS — R5383 Other fatigue: Secondary | ICD-10-CM | POA: Diagnosis not present

## 2014-10-07 DIAGNOSIS — E119 Type 2 diabetes mellitus without complications: Secondary | ICD-10-CM

## 2014-10-07 DIAGNOSIS — E1169 Type 2 diabetes mellitus with other specified complication: Secondary | ICD-10-CM

## 2014-10-07 DIAGNOSIS — E785 Hyperlipidemia, unspecified: Secondary | ICD-10-CM | POA: Diagnosis not present

## 2014-10-07 MED ORDER — BECLOMETHASONE DIPROPIONATE 80 MCG/ACT NA AERS: INHALATION_SPRAY | NASAL | Status: DC

## 2014-10-07 MED ORDER — AZITHROMYCIN 250 MG PO TABS: ORAL_TABLET | ORAL | Status: AC

## 2014-10-07 MED ORDER — COLESEVELAM HCL 625 MG PO TABS: 1875.0000 mg | ORAL_TABLET | Freq: Two times a day (BID) | ORAL | Status: DC

## 2014-10-07 MED ORDER — ARMODAFINIL 150 MG PO TABS: 150.0000 mg | ORAL_TABLET | Freq: Every day | ORAL | Status: DC

## 2014-10-07 NOTE — Progress Notes (Signed)
CC: Jeremiah Long is a 39 y.o. male is here for Follow-up   Subjective: HPI:  Follow-up fatigue:reports most days of the week he will have severe deficiency in energy at the end of the day. He reports a situation where he will feel a wave of fatigue to a degree that he thinks he could fall asleep behind the wheel of her driving. He believes that his current sleep habits are restorative and he is not certain what's causing his fatigue. We have done a variety of labs all of which are normal. He tells me has plenty of energy as long as it's in the morning but then its unpredictable how well be feeling in the afternoon. Other than fatigue he has no other symptoms that he can identify other than a cough that's been present for the last 2 weeks slightly improves with albuterol only for a few hours. He denies shortness of breath, fevers, chills, abdominal pain, diarrhea, constipation, appetite suppression nor any motor or sensory disturbances other than that described above  Requesting refills on WelChol and Qnasl  Review Of Systems Outlined In HPI  Past Medical History  Diagnosis Date  . Chronic gout 11/02/2011  . Depression 12/16/2011  . Diabetes mellitus type 2 in obese 11/02/2011  . Edema 12/16/2011  . Hx of adenomatous colonic polyps 11/02/2011  . Hyperlipidemia LDL goal <160 11/02/2011  . Hypogonadism male 11/02/2011  . Hypothyroidism 11/02/2011  . Obesity, Class II, BMI 35-39.9, with comorbidity 12/16/2011  . Right C6 radiculopathy 12/16/2011    No past surgical history on file. Family History  Problem Relation Age of Onset  . Stroke Mother     History   Social History  . Marital Status: Married    Spouse Name: N/A  . Number of Children: N/A  . Years of Education: N/A   Occupational History  . Not on file.   Social History Main Topics  . Smoking status: Never Smoker   . Smokeless tobacco: Not on file  . Alcohol Use: No  . Drug Use: No  . Sexual Activity: Not on file   Other  Topics Concern  . Not on file   Social History Narrative     Objective: BP 131/92 mmHg  Pulse 88  Wt 264 lb (119.75 kg)  Vital signs reviewed. General: Alert and Oriented, No Acute Distress HEENT: Pupils equal, round, reactive to light. Conjunctivae clear.  External ears unremarkable.  Moist mucous membranes. Lungs: Clear and comfortable work of breathing, speaking in full sentences without accessory muscle use. Cardiac: Regular rate and rhythm.  Neuro: CN II-XII grossly intact, gait normal. Extremities: No peripheral edema.  Strong peripheral pulses.  Mental Status: No depression, anxiety, nor agitation. Logical though process. Skin: Warm and dry.  Assessment & Plan: Jeremiah Long was seen today for follow-up.  Diagnoses and all orders for this visit:  Diabetes mellitus type 2 in obese Orders: -     colesevelam (WELCHOL) 625 MG tablet; Take 3 tablets (1,875 mg total) by mouth 2 (two) times daily with a meal.  Hyperlipidemia LDL goal <160 Orders: -     colesevelam (WELCHOL) 625 MG tablet; Take 3 tablets (1,875 mg total) by mouth 2 (two) times daily with a meal.  Other fatigue Orders: -     Armodafinil (NUVIGIL) 150 MG tablet; Take 1 tablet (150 mg total) by mouth daily. To prevent energy crashes.  Other orders -     Beclomethasone Dipropionate (QNASL) 80 MCG/ACT AERS; Two sprays each nostril daily. -  azithromycin (ZITHROMAX) 250 MG tablet; Take two tabs at once on day 1, then one tab daily on days 2-5.   Fatigue: Recommended sleep study however he is adamant about not repeating this since it was normal in 2014 for almost identical symptoms. Discussed that I'm at a loss to explain his fatigue but I don't  Think it reflects something life-threatening other than the possibility of sleep apnea. Will try Nuvigil in hopes of preventing symptoms from occurring. Azithromycin provided for bronchitis with his history of asthma,no appreciable wheezing today.  40 minutes spent  face-to-face during visit today of which at least 50% was counseling or coordinating care regarding: 1. Diabetes mellitus type 2 in obese   2. Hyperlipidemia LDL goal <160   3. Other fatigue      Return in about 4 weeks (around 11/04/2014), or if symptoms worsen or fail to improve.

## 2014-10-08 ENCOUNTER — Telehealth: Payer: Self-pay | Admitting: Family Medicine

## 2014-10-08 NOTE — Telephone Encounter (Signed)
Received fax for prior authorization on Nuvigil I called [employer] and spoke with Robyn the medication was denied due to not being prescribed for a FDA approved diagnosis. We will receive a faxed letter.  Case #[id number]. - CF

## 2014-10-16 ENCOUNTER — Telehealth: Payer: Self-pay | Admitting: Family Medicine

## 2014-10-16 DIAGNOSIS — R5383 Other fatigue: Secondary | ICD-10-CM | POA: Insufficient documentation

## 2014-10-16 MED ORDER — MODAFINIL 200 MG PO TABS: 200.0000 mg | ORAL_TABLET | Freq: Every day | ORAL | Status: DC

## 2014-10-16 NOTE — Telephone Encounter (Signed)
Sue Lushndrea, Will you please let patient know that his insurance company will not cover Nuvigil.  I've printed off a similar acting medication called Provigil that I'd recommend he submit to his pharmacy to see if this is covered.  Rx in your inbox.

## 2014-10-16 NOTE — Telephone Encounter (Signed)
Pt notified of new rx & I faxed it to his pharm.

## 2014-10-18 ENCOUNTER — Telehealth: Payer: Self-pay | Admitting: Family Medicine

## 2014-10-18 NOTE — Telephone Encounter (Signed)
Received fax from pharmacy for prior authorization on modafinil 200 mg. I called [employer] and did prior authorization over the phone. The medication is approved from 10/08/2014 - 10/18/2015 case ID [id number]. - CF

## 2014-10-21 ENCOUNTER — Other Ambulatory Visit: Payer: Self-pay

## 2014-10-21 ENCOUNTER — Ambulatory Visit: Admitting: Family Medicine

## 2014-10-21 DIAGNOSIS — M542 Cervicalgia: Principal | ICD-10-CM

## 2014-10-21 DIAGNOSIS — G8929 Other chronic pain: Secondary | ICD-10-CM

## 2014-10-21 MED ORDER — OXYCODONE-ACETAMINOPHEN 7.5-325 MG PO TABS: 1.0000 | ORAL_TABLET | Freq: Two times a day (BID) | ORAL | Status: DC | PRN

## 2014-10-23 ENCOUNTER — Other Ambulatory Visit: Payer: Self-pay | Admitting: Family Medicine

## 2014-10-28 ENCOUNTER — Other Ambulatory Visit: Payer: Self-pay | Admitting: Family Medicine

## 2014-10-29 ENCOUNTER — Other Ambulatory Visit: Payer: Self-pay | Admitting: Family Medicine

## 2014-10-29 NOTE — Telephone Encounter (Signed)
Rx sent to [facility] on [address]

## 2014-10-31 ENCOUNTER — Other Ambulatory Visit: Payer: Self-pay | Admitting: Family Medicine

## 2014-11-14 ENCOUNTER — Other Ambulatory Visit: Payer: Self-pay | Admitting: Family Medicine

## 2014-11-28 ENCOUNTER — Other Ambulatory Visit: Payer: Self-pay

## 2014-11-28 DIAGNOSIS — G8929 Other chronic pain: Secondary | ICD-10-CM

## 2014-11-28 DIAGNOSIS — M542 Cervicalgia: Principal | ICD-10-CM

## 2014-11-28 MED ORDER — OXYCODONE-ACETAMINOPHEN 7.5-325 MG PO TABS: 1.0000 | ORAL_TABLET | Freq: Two times a day (BID) | ORAL | Status: DC | PRN

## 2014-12-11 ENCOUNTER — Other Ambulatory Visit: Payer: Self-pay | Admitting: Family Medicine

## 2015-01-02 ENCOUNTER — Other Ambulatory Visit: Payer: Self-pay | Admitting: Family Medicine

## 2015-01-02 DIAGNOSIS — M542 Cervicalgia: Principal | ICD-10-CM

## 2015-01-02 DIAGNOSIS — G8929 Other chronic pain: Secondary | ICD-10-CM

## 2015-01-02 MED ORDER — OXYCODONE-ACETAMINOPHEN 7.5-325 MG PO TABS: 1.0000 | ORAL_TABLET | Freq: Two times a day (BID) | ORAL | Status: DC | PRN

## 2015-01-02 NOTE — Telephone Encounter (Signed)
Jeremiah Long, Rx placed in in-box ready for pickup/faxing.  

## 2015-02-12 ENCOUNTER — Telehealth: Payer: Self-pay | Admitting: Family Medicine

## 2015-02-12 DIAGNOSIS — G8929 Other chronic pain: Secondary | ICD-10-CM

## 2015-02-12 DIAGNOSIS — M542 Cervicalgia: Principal | ICD-10-CM

## 2015-02-12 DIAGNOSIS — E669 Obesity, unspecified: Secondary | ICD-10-CM

## 2015-02-12 DIAGNOSIS — E1169 Type 2 diabetes mellitus with other specified complication: Secondary | ICD-10-CM

## 2015-02-12 MED ORDER — OXYCODONE-ACETAMINOPHEN 7.5-325 MG PO TABS: 1.0000 | ORAL_TABLET | Freq: Two times a day (BID) | ORAL | Status: DC | PRN

## 2015-02-12 NOTE — Telephone Encounter (Signed)
Pt.notified

## 2015-02-12 NOTE — Telephone Encounter (Signed)
Pt called to request updated labs to check his "kidneys and liver." States he is still having a lot of edema, states the Lasix isn't helping. Advised Pt he needs to follow up after completing his blood work and transferred him to scheduling.  Pt also request a refill on his Percocet.

## 2015-02-12 NOTE — Telephone Encounter (Signed)
Jeremiah Long, Rx placed in in-box ready for pickup/faxing.  

## 2015-02-13 ENCOUNTER — Other Ambulatory Visit: Payer: Self-pay | Admitting: Family Medicine

## 2015-02-14 LAB — COMPLETE METABOLIC PANEL WITH GFR
ALBUMIN: 4.2 g/dL (ref 3.6–5.1)
ALK PHOS: 84 U/L (ref 40–115)
ALT: 45 U/L (ref 9–46)
AST: 27 U/L (ref 10–40)
BUN: 17 mg/dL (ref 7–25)
CALCIUM: 9.2 mg/dL (ref 8.6–10.3)
CHLORIDE: 99 mmol/L (ref 98–110)
CO2: 27 mmol/L (ref 20–31)
CREATININE: 1.07 mg/dL (ref 0.60–1.35)
GFR, Est African American: >89 mL/min (ref >=60)
GFR, Est Non African American: 86 mL/min (ref >=60)
GLUCOSE: 192 mg/dL — AB (ref 65–99)
POTASSIUM: 3.4 mmol/L — AB (ref 3.5–5.3)
SODIUM: 136 mmol/L (ref 135–146)
Total Bilirubin: 1 mg/dL (ref 0.2–1.2)
Total Protein: 7.2 g/dL (ref 6.1–8.1)

## 2015-02-14 LAB — HEMOGLOBIN A1C
HEMOGLOBIN A1C: 6.7 % — AB (ref <5.7)
Mean Plasma Glucose: 146 mg/dL — ABNORMAL HIGH (ref <117)

## 2015-02-19 ENCOUNTER — Encounter: Payer: Self-pay | Admitting: Family Medicine

## 2015-02-19 ENCOUNTER — Ambulatory Visit (INDEPENDENT_AMBULATORY_CARE_PROVIDER_SITE_OTHER): Payer: Self-pay | Admitting: Family Medicine

## 2015-02-19 DIAGNOSIS — Z0289 Encounter for other administrative examinations: Secondary | ICD-10-CM

## 2015-02-19 NOTE — Progress Notes (Signed)
No show 02/19/2015

## 2015-03-04 ENCOUNTER — Telehealth: Payer: Self-pay

## 2015-03-06 NOTE — Telephone Encounter (Signed)
Opened in error

## 2015-03-10 ENCOUNTER — Ambulatory Visit (INDEPENDENT_AMBULATORY_CARE_PROVIDER_SITE_OTHER): Admitting: Family Medicine

## 2015-03-10 ENCOUNTER — Encounter: Payer: Self-pay | Admitting: Family Medicine

## 2015-03-10 VITALS — BP 131/87 | HR 85 | Wt 277.0 lb

## 2015-03-10 DIAGNOSIS — E291 Testicular hypofunction: Secondary | ICD-10-CM

## 2015-03-10 DIAGNOSIS — J302 Other seasonal allergic rhinitis: Secondary | ICD-10-CM | POA: Diagnosis not present

## 2015-03-10 DIAGNOSIS — M542 Cervicalgia: Secondary | ICD-10-CM | POA: Diagnosis not present

## 2015-03-10 DIAGNOSIS — R601 Generalized edema: Secondary | ICD-10-CM

## 2015-03-10 DIAGNOSIS — M62838 Other muscle spasm: Secondary | ICD-10-CM

## 2015-03-10 DIAGNOSIS — E781 Pure hyperglyceridemia: Secondary | ICD-10-CM | POA: Diagnosis not present

## 2015-03-10 DIAGNOSIS — G8929 Other chronic pain: Secondary | ICD-10-CM

## 2015-03-10 DIAGNOSIS — K219 Gastro-esophageal reflux disease without esophagitis: Secondary | ICD-10-CM

## 2015-03-10 DIAGNOSIS — M2550 Pain in unspecified joint: Secondary | ICD-10-CM

## 2015-03-10 MED ORDER — DIAZEPAM 5 MG PO TABS: ORAL_TABLET | ORAL | Status: DC

## 2015-03-10 MED ORDER — DICLOFENAC SODIUM 50 MG PO TBEC: DELAYED_RELEASE_TABLET | ORAL | Status: DC

## 2015-03-10 MED ORDER — PHENTERMINE HCL 37.5 MG PO TABS: 37.5000 mg | ORAL_TABLET | Freq: Every day | ORAL | Status: DC

## 2015-03-10 MED ORDER — TESTOSTERONE CYPIONATE 200 MG/ML IM SOLN: INTRAMUSCULAR | Status: DC

## 2015-03-10 MED ORDER — OMEPRAZOLE 40 MG PO CPDR: 40.0000 mg | DELAYED_RELEASE_CAPSULE | Freq: Every day | ORAL | Status: DC

## 2015-03-10 MED ORDER — TORSEMIDE 20 MG PO TABS: ORAL_TABLET | ORAL | Status: DC

## 2015-03-10 MED ORDER — FLUTICASONE PROPIONATE 50 MCG/ACT NA SUSP: NASAL | Status: DC

## 2015-03-10 MED ORDER — HYDROXYZINE HCL 50 MG PO TABS: 50.0000 mg | ORAL_TABLET | Freq: Every day | ORAL | Status: DC | PRN

## 2015-03-10 MED ORDER — OXYCODONE-ACETAMINOPHEN 7.5-325 MG PO TABS: 1.0000 | ORAL_TABLET | Freq: Two times a day (BID) | ORAL | Status: DC | PRN

## 2015-03-10 MED ORDER — ICOSAPENT ETHYL 1 G PO CAPS: 2.0000 | ORAL_CAPSULE | Freq: Two times a day (BID) | ORAL | Status: DC

## 2015-03-10 MED ORDER — LEVOTHYROXINE SODIUM 137 MCG PO TABS: ORAL_TABLET | ORAL | Status: DC

## 2015-03-10 MED ORDER — BECLOMETHASONE DIPROPIONATE 80 MCG/ACT NA AERS
INHALATION_SPRAY | NASAL | Status: DC
Start: 2015-03-10 — End: 2015-08-26

## 2015-03-10 MED ORDER — FEBUXOSTAT 80 MG PO TABS: 1.0000 | ORAL_TABLET | Freq: Every day | ORAL | Status: DC

## 2015-03-10 NOTE — Progress Notes (Signed)
CC: Jeremiah Long is a 39 y.o. male is here for Edema in Wrist and Ankle   Subjective: HPI:  He presents today for evaluation of ankle and wrist edema. Symptoms are worse after salty foods or beverages. Symptoms are improved with taking Lasix however he was requiring 120 mg daily and at this dose he gets intolerable cramping of the muscles. Symptoms seem to be symmetrical and evenly distributed. He denies any new medications since this onset occurred 2 weeks ago. Symptoms are present on a daily basis and worse first thing in the morning. He notices that his ankles and wrists seem to hurt in the joints proportionally to the degree of swelling. He denies any orthopnea or shortness of breath. Denies irregular heartbeat. He denies any his coloration or bruising of the skin overlying these sites of swelling.  He is requesting refills on  Diazepam and Percocet. He takes this on an as-needed basis for his posterior neck pain and cervical radiculopathy. He is very happy with his current pain medication regimen and tells me pain is not interfering with quality of life provided he takes these medications.    Requesting refills on Flonase. He's been using this to reduce symptoms of nasal congestion when around pollen or ragweed.  Follow-up hypercholesterolemia: Requesting refills on vascepa    Review Of Systems Outlined In HPI  Past Medical History  Diagnosis Date  . Chronic gout 11/02/2011  . Depression 12/16/2011  . Diabetes mellitus type 2 in obese (HCC) 11/02/2011  . Edema 12/16/2011  . Hx of adenomatous colonic polyps 11/02/2011  . Hyperlipidemia LDL goal <160 11/02/2011  . Hypogonadism male 11/02/2011  . Hypothyroidism 11/02/2011  . Obesity, Class II, BMI 35-39.9, with comorbidity (HCC) 12/16/2011  . Right C6 radiculopathy 12/16/2011    No past surgical history on file. Family History  Problem Relation Age of Onset  . Stroke Mother     Social History   Social History  . Marital Status:  Married    Spouse Name: N/A  . Number of Children: N/A  . Years of Education: N/A   Occupational History  . Not on file.   Social History Main Topics  . Smoking status: Never Smoker   . Smokeless tobacco: Not on file  . Alcohol Use: No  . Drug Use: No  . Sexual Activity: Not on file   Other Topics Concern  . Not on file   Social History Narrative     Objective: BP 131/87 mmHg  Pulse 85  Wt 277 lb (125.646 kg)  General: Alert and Oriented, No Acute Distress HEENT: Pupils equal, round, reactive to light. Conjunctivae clear.  Moist mucous membranes Lungs: Clear to auscultation bilaterally, no wheezing/ronchi/rales.  Comfortable work of breathing. Good air movement. Cardiac: Regular rate and rhythm. Normal S1/S2.  No murmurs, rubs, nor gallops.   Abdomen: obese and soft Extremities: questionable edema of the wrists..  Strong peripheral pulses.  Mental Status: No depression, anxiety, nor agitation. Skin: Warm and dry.  Assessment & Plan: Fabio was seen today for edema in wrist and ankle.  Diagnoses and all orders for this visit:  Muscle spasm -     diazepam (VALIUM) 5 MG tablet; One by mouth every night to help with pain/sleep, may take one additional dose in the daytime as needed for neck pain.  Chronic neck pain -     diclofenac (VOLTAREN) 50 MG EC tablet; Take one tablet every 8 hours only as needed for pain, take with small snack. -  oxyCODONE-acetaminophen (PERCOCET) 7.5-325 MG tablet; Take 1 tablet by mouth 2 (two) times daily as needed (neck pain).  Seasonal allergies -     fluticasone (FLONASE) 50 MCG/ACT nasal spray; Two sprays nostril daily.  Hypertriglyceridemia -     Icosapent Ethyl (VASCEPA) 1 G CAPS; Take 2 capsules by mouth 2 (two) times daily with a meal.  Gastroesophageal reflux disease without esophagitis -     omeprazole (PRILOSEC) 40 MG capsule; Take 1 capsule (40 mg total) by mouth daily.  Hypogonadism male -     testosterone cypionate  (DEPOTESTOSTERONE CYPIONATE) 200 MG/ML injection; INJECT 1 ML EVERY 21 DAYS  Multiple joint pain -     Ambulatory referral to Rheumatology  Generalized edema -     torsemide (DEMADEX) 20 MG tablet; One to two twice a day as needed for swelling. Discontinue furosemide.  Other orders -     Beclomethasone Dipropionate (QNASL) 80 MCG/ACT AERS; Two sprays each nostril daily. -     hydrOXYzine (ATARAX/VISTARIL) 50 MG tablet; Take 1 tablet (50 mg total) by mouth daily as needed for anxiety. -     levothyroxine (SYNTHROID, LEVOTHROID) 137 MCG tablet; TAKE 1 TABLET (137 MCG TOTAL) BY MOUTH DAILY. -     phentermine (ADIPEX-P) 37.5 MG tablet; Take 1 tablet (37.5 mg total) by mouth daily before breakfast. -     Febuxostat (ULORIC) 80 MG TABS; Take 1 tablet (80 mg total) by mouth daily.   Changing furosemide to torsemide, referral to rheumatology for further evaluation of his multiple joint pain that has been evolving over the last year. I like to get their opinion on whether or not there is a autoimmune process at play here. Discussed reduction of sodium intake to help with edema. He politely declines his thyroid function being checked today.  Chronic neck and radicular symptoms are controlled with diazepam and Percocet.  Return in about 4 weeks (around 04/07/2015) for Weight and BP check.

## 2015-03-11 ENCOUNTER — Other Ambulatory Visit: Payer: Self-pay | Admitting: Family Medicine

## 2015-03-14 ENCOUNTER — Other Ambulatory Visit: Payer: Self-pay | Admitting: Family Medicine

## 2015-03-24 ENCOUNTER — Telehealth: Payer: Self-pay | Admitting: Family Medicine

## 2015-03-24 DIAGNOSIS — E1169 Type 2 diabetes mellitus with other specified complication: Secondary | ICD-10-CM

## 2015-03-24 DIAGNOSIS — E039 Hypothyroidism, unspecified: Secondary | ICD-10-CM

## 2015-03-24 DIAGNOSIS — R609 Edema, unspecified: Secondary | ICD-10-CM

## 2015-03-24 DIAGNOSIS — E669 Obesity, unspecified: Secondary | ICD-10-CM

## 2015-03-24 NOTE — Telephone Encounter (Signed)
Will you please ask patient for conditions or symptoms that he's requesting the referrals for?  I'm more than happy to arrange this but the eventual office he's referred to needs to know why.

## 2015-03-24 NOTE — Telephone Encounter (Signed)
The rheumatologist that you referred him to recommended that you as his PCP refer him to cardiology and endocrinology. He reports that they did lab work and the labs where normal.  Also you switched his medication from furosemide to torsemide and pt states that that medication isn't working either.

## 2015-03-24 NOTE — Telephone Encounter (Signed)
Patient called advised that he needs 2 referrals to the Heart doctor and to a Endocrinologist. Thanks

## 2015-03-26 NOTE — Telephone Encounter (Signed)
Pt.notified

## 2015-03-26 NOTE — Telephone Encounter (Signed)
Requested referrals have been ordered.  Cardiology to address his fluid retention and Endocrinology to address hypothyroidism and diabetes.

## 2015-04-01 ENCOUNTER — Telehealth: Payer: Self-pay | Admitting: Family Medicine

## 2015-04-01 NOTE — Telephone Encounter (Signed)
Patient needs scripts written for Phentermine and Oxycodone.  He wants to pick up next Monday.  We moved his appt with you back 2 weeks so you will hopefully get information from the 2 physicians he is seeing at the end of the week.  thanks

## 2015-04-01 NOTE — Telephone Encounter (Signed)
Is it appropriate for these medications to be filled?

## 2015-04-02 NOTE — Telephone Encounter (Signed)
Pt advised.

## 2015-04-02 NOTE — Telephone Encounter (Signed)
Oxycodone is ok to be filled on Monday, phentermine requires a nurse visit for BP and weight check.

## 2015-04-03 ENCOUNTER — Ambulatory Visit (INDEPENDENT_AMBULATORY_CARE_PROVIDER_SITE_OTHER): Admitting: Endocrinology

## 2015-04-03 ENCOUNTER — Encounter: Payer: Self-pay | Admitting: Endocrinology

## 2015-04-03 VITALS — BP 118/70 | HR 88 | Temp 98.6°F | Ht 71.0 in | Wt 280.0 lb

## 2015-04-03 DIAGNOSIS — E039 Hypothyroidism, unspecified: Secondary | ICD-10-CM | POA: Diagnosis not present

## 2015-04-03 LAB — TSH: TSH: 2.9 u[IU]/mL (ref 0.35–4.50)

## 2015-04-03 MED ORDER — GLUCOSE BLOOD VI STRP: 1.0000 | ORAL_STRIP | Status: DC

## 2015-04-03 MED ORDER — METFORMIN HCL ER 500 MG PO TB24: 1000.0000 mg | ORAL_TABLET | Freq: Every day | ORAL | Status: DC

## 2015-04-03 NOTE — Patient Instructions (Addendum)
good diet and exercise significantly improve the control of your diabetes.  please let me know if you wish to be referred to a dietician.  high blood sugar is very risky to your health.  you should see an eye doctor and dentist every year.  It is very important to get all recommended vaccinations.  controlling your blood pressure and cholesterol drastically reduces the damage diabetes does to your body.  Those who smoke should quit.  please discuss these with your doctor.  check your blood sugar once a week.  vary the time of day when you check, between before the 3 meals, and at bedtime.  also check if you have symptoms of your blood sugar being too high or too low.  please keep a record of the readings and bring it to your next appointment here (or you can bring the meter itself).  You can write it on any piece of paper.  please call us sooner if your blood sugar goes below 70, or if you have a lot of readings over 200. Here is a meter.  i have sent a prescription to your pharmacy, for strips. i have sent a prescription to your pharmacy, to resume the metformin.  Please continue the same levothyroxine.   Please come back for a follow-up appointment in 3 months.

## 2015-04-03 NOTE — Progress Notes (Signed)
Subjective:    Patient ID: Jeremiah Long, male    DOB: August 07, 1975, 39 y.o.   MRN: [id number]  HPI pt was noted to have hyperglycemia in 2015, and a1c has been steadily increasing since then; he has mild if any neuropathy of the lower extremities; he is unaware of any associated chronic complications; he has never been on insulin, but he took metformin for a few months in early 2016; pt says his diet and exercise are good; he has never had pancreatitis, severe hypoglycemia or DKA.   Past Medical History  Diagnosis Date  . Chronic gout 11/02/2011  . Depression 12/16/2011  . Diabetes mellitus type 2 in obese (HCC) 11/02/2011  . Edema 12/16/2011  . Hx of adenomatous colonic polyps 11/02/2011  . Hyperlipidemia LDL goal <160 11/02/2011  . Hypogonadism male 11/02/2011  . Hypothyroidism 11/02/2011  . Obesity, Class II, BMI 35-39.9, with comorbidity (HCC) 12/16/2011  . Right C6 radiculopathy 12/16/2011    No past surgical history on file.  Social History   Social History  . Marital Status: Married    Spouse Name: N/A  . Number of Children: N/A  . Years of Education: N/A   Occupational History  . Not on file.   Social History Main Topics  . Smoking status: Light Tobacco Smoker    Types: Cigars  . Smokeless tobacco: Former Neurosurgeon    Types: Chew    Quit date: 05/17/2001  . Alcohol Use: No  . Drug Use: No  . Sexual Activity: Not on file   Other Topics Concern  . Not on file   Social History Narrative    Current Outpatient Prescriptions on File Prior to Visit  Medication Sig Dispense Refill  . albuterol (PROVENTIL HFA;VENTOLIN HFA) 108 (90 BASE) MCG/ACT inhaler Inhale 2 puffs into the lungs every 6 (six) hours as needed for wheezing. 1 Inhaler 4  . AMBULATORY NON FORMULARY MEDICATION 3 ml syringes 22 g 1 1/2 inch needles  For use with testosterone injection 100 each 11  . atorvastatin (LIPITOR) 80 MG tablet TAKE 1 TABLET (80 MG TOTAL) BY MOUTH DAILY. 90 tablet 3  . Beclomethasone  Dipropionate (QNASL) 80 MCG/ACT AERS Two sprays each nostril daily. 1 Inhaler 5  . diazepam (VALIUM) 5 MG tablet One by mouth every night to help with pain/sleep, may take one additional dose in the daytime as needed for neck pain. 60 tablet 0  . Febuxostat (ULORIC) 80 MG TABS Take 1 tablet (80 mg total) by mouth daily. 30 tablet 4  . fenofibrate (TRICOR) 145 MG tablet TAKE 1 TABLET BY MOUTH EVERY DAY 30 tablet 7  . fluticasone (FLONASE) 50 MCG/ACT nasal spray Two sprays nostril daily. 16 g 2  . Icosapent Ethyl (VASCEPA) 1 G CAPS Take 2 capsules by mouth 2 (two) times daily with a meal. 120 capsule 5  . KLOR-CON M20 20 MEQ tablet TAKE 1 TABLET (20 MEQ TOTAL) BY MOUTH 2 (TWO) TIMES DAILY. 180 tablet 1  . levothyroxine (SYNTHROID, LEVOTHROID) 137 MCG tablet TAKE 1 TABLET (137 MCG TOTAL) BY MOUTH DAILY. 90 tablet 0  . Linaclotide (LINZESS) 145 MCG CAPS capsule Take 1 capsule (145 mcg total) by mouth daily. 14 capsule 0  . modafinil (PROVIGIL) 200 MG tablet Take 1 tablet (200 mg total) by mouth daily. 30 tablet 1  . montelukast (SINGULAIR) 10 MG tablet TAKE 1 TABLET (10 MG TOTAL) BY MOUTH AT BEDTIME. 30 tablet 3  . omeprazole (PRILOSEC) 40 MG capsule Take 1 capsule (  40 mg total) by mouth daily. 90 capsule 3  . oxyCODONE-acetaminophen (PERCOCET) 7.5-325 MG tablet Take 1 tablet by mouth 2 (two) times daily as needed (neck pain). 60 tablet 0  . phentermine (ADIPEX-P) 37.5 MG tablet Take 1 tablet (37.5 mg total) by mouth daily before breakfast. 30 tablet 0  . testosterone cypionate (DEPOTESTOSTERONE CYPIONATE) 200 MG/ML injection INJECT 1 ML EVERY 21 DAYS 10 mL 0  . topiramate (TOPAMAX) 50 MG tablet TAKE 1 TABLET BY MOUTH 2 TIMES DAILY. 60 tablet 2  . Vitamin D, Ergocalciferol, (DRISDOL) 50000 UNITS CAPS capsule Take 1 capsule (50,000 Units total) by mouth every 7 (seven) days. Recheck Vitamin D in 3 Months 12 capsule 0  . allopurinol (ZYLOPRIM) 300 MG tablet Take 1 tablet (300 mg total) by mouth daily. 90  tablet 3   No current facility-administered medications on file prior to visit.    Allergies  Allergen Reactions  . Effexor Xr [Venlafaxine Hcl Er]     fatigue  . Lexapro [Escitalopram Oxalate]     Panic attacks  . Morphine And Related Other (See Comments)    Migraines  . Latex Itching    Family History  Problem Relation Age of Onset  . Stroke Mother   . Diabetes Father   . Thyroid disease Neg Hx     BP 118/70 mmHg  Pulse 88  Temp(Src) 98.6 F (37 C) (Oral)  Ht  (1.803 m)  Wt 280 lb (127.007 kg)  BMI 39.07 kg/m2  SpO2 95%  Review of Systems denies blurry vision, chest pain, sob, n/v, urinary frequency, excessive diaphoresis, cold intolerance, rhinorrhea, and easy bruising.   He has fatigue, weight gain, chronic headache, depression, muscle cramps, and chronic diffuse pain.       Objective:   Physical Exam VS: see vs page GEN: no distress HEAD: head: no deformity eyes: no periorbital swelling, no proptosis external nose and ears are normal mouth: no lesion seen NECK: supple, thyroid is not enlarged CHEST WALL: no deformity LUNGS:  Clear to auscultation CV: reg rate and rhythm, no murmur ABD: abdomen is soft, nontender.  no hepatosplenomegaly.  not distended.  no hernia.  MUSCULOSKELETAL: muscle bulk and strength are grossly normal.  no obvious joint swelling.  gait is normal and steady EXTEMITIES: no deformity.  no ulcer on the feet.  feet are of normal color and temp.  1+ bilat leg edema.   PULSES: dorsalis pedis intact bilat.  no carotid bruit. NEURO:  cn 2-12 grossly intact.   readily moves all 4's.  sensation is intact to touch on the feet SKIN:  Normal texture and temperature.  No rash or suspicious lesion is visible.   NODES:  None palpable at the neck PSYCH: alert, well-oriented.  Does not appear anxious nor depressed.   Radiol: MRI of C-spine (04/11/12): no mention is made of any goiter.   Lab Results  Component Value Date   HGBA1C 6.7*  02/12/2015   Lab Results  Component Value Date   TSH 1.926 10/02/2014   I have reviewed outside records, and summarized: Pt was noted to have increasing a1c, and referred here.     Assessment & Plan:  DM: she should resume metformin.  Hypothyroidism: new to me.  well-replaced.   Weight gain: this complicates the rx of DM.    Patient is advised the following: Patient Instructions  good diet and exercise significantly improve the control of your diabetes.  please let me know if you wish to be  referred to a dietician.  high blood sugar is very risky to your health.  you should see an eye doctor and dentist every year.  It is very important to get all recommended vaccinations.  controlling your blood pressure and cholesterol drastically reduces the damage diabetes does to your body.  Those who smoke should quit.  please discuss these with your doctor.  check your blood sugar once a week.  vary the time of day when you check, between before the 3 meals, and at bedtime.  also check if you have symptoms of your blood sugar being too high or too low.  please keep a record of the readings and bring it to your next appointment here (or you can bring the meter itself).  You can write it on any piece of paper.  please call us sooner if your blood sugar goes below 70, or if you have a lot of readings over 200. Here is a meter.  i have sent a prescription to your pharmacy, for strips. i have sent a prescription to your pharmacy, to resume the metformin.  Please continue the same levothyroxine.   Please come back for a follow-up appointment in 3 months.

## 2015-04-04 ENCOUNTER — Ambulatory Visit (INDEPENDENT_AMBULATORY_CARE_PROVIDER_SITE_OTHER): Admitting: Cardiovascular Disease

## 2015-04-04 ENCOUNTER — Ambulatory Visit: Admitting: Cardiovascular Disease

## 2015-04-04 ENCOUNTER — Encounter: Payer: Self-pay | Admitting: Cardiovascular Disease

## 2015-04-04 VITALS — BP 120/80 | HR 90 | Resp 16 | Ht 71.0 in | Wt 286.4 lb

## 2015-04-04 DIAGNOSIS — R0602 Shortness of breath: Secondary | ICD-10-CM

## 2015-04-04 DIAGNOSIS — R601 Generalized edema: Secondary | ICD-10-CM

## 2015-04-04 DIAGNOSIS — E785 Hyperlipidemia, unspecified: Secondary | ICD-10-CM

## 2015-04-04 MED ORDER — POTASSIUM CHLORIDE ER 10 MEQ PO TBCR: 20.0000 meq | EXTENDED_RELEASE_TABLET | Freq: Every day | ORAL | Status: DC

## 2015-04-04 MED ORDER — TORSEMIDE 20 MG PO TABS: 40.0000 mg | ORAL_TABLET | Freq: Every day | ORAL | Status: DC

## 2015-04-04 NOTE — Patient Instructions (Signed)
Your physician has requested that you have an echocardiogram. Echocardiography is a painless test that uses sound waves to create images of your heart. It provides your doctor with information about the size and shape of your heart and how well your heart's chambers and valves are working. This procedure takes approximately one hour. There are no restrictions for this procedure.  Your physician has recommended you make the following change in your medication:   INCREASE TORSEMIDE TO 40 MG DAILY  INCREASE POTASSIUM TO 2 TABLETS DAILY.  CALL AND LET US KNOW WHAT STRENGTH YOUR TABLETS ARE  [facility]  Dr. Royann Shiversroitoru recommends that you schedule a follow-up appointment in: 1ST AVAILABLE

## 2015-04-04 NOTE — Progress Notes (Signed)
Patient ID: Jeremiah Long, male   DOB: 08/04/1975, 39 y.o.   MRN: [id number]     Cardiology Office Note   Date:  04/06/2015   ID:  [name], DOB [birth year], MRN [id number]  PCP:  Laren Boom, DO  Cardiologist:   Thurmon Fair, MD   Chief Complaint  Patient presents with  . Edema    NP -consult      History of Present Illness: Jeremiah Long is a 39 y.o. male who presents for what he describes as uncontrollable edema of his hands and ankles.   the swelling has been present for the last 2 or 3 months and is steadily worsening, despite the fact that he is taking diuretics. Furosemide was switched to torsemide  When he developed intolerable muscle cramping on a dose of 120 mg furosemide daily.  Despite this he still has swelling in his ankles and pretibial area and states that his fingers are swollen so that he cannot put on rings. The swelling usually does not resolve after overnight supine position This led to a rheumatological evaluation which I understand was unrevealing. At one point he was diagnosed with gout but this was apparently not confirmed. He does take indication for hyperuricemia. He denies exertional dyspnea, chest discomfort, syncope, dizziness, focal neurological complaints, intermittent claudication or erectile dysfunction.   He has multiple metabolic problems. He has had steadily worsening hyperglycemia for a few years and now takes metformin for diabetes mellitus. He has had mixed hyperlipidemia and is taking 4 different agents for lipid lowering: atorvastatin, fenofibrate, WelChol and Vascepa.  She is morbidly obese. He has treated hypothyroidism.  He sees Dr. Everardo All for his endocrinology problems. His most recent hemoglobin A1c was good at 6.7%, TSH was normal , LDL was beyond excellent at 28, HDL was slightly low at 36 and triglycerides were slightly elevated at 226.   He has multiple musculoskeletal problems including cervical spine disease with  right C6 radiculopathy. He has seasonal allergies.  He has poor sleep but reports that a sleep study was normal 2-1/2 years ago. At that time he weighed about 270 pounds ( about 65 pounds less than he does today). He reports previous treatment for Lyme disease.  He receives testosterone supplements for androgen deficiency and is taking phentermine for weight loss.   His medication list contains numerous drugs that he reports are no longer active including diclofenac, Celebrex, Provigil, Topamax , allopurinol. His medication list states that he is taking potassium 20 mEq twice daily , but he reports he is taking only once daily and is uncertain of the actual dose.  He is originally from [place] and has served in the armed forces. 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 He is divorced but has a live-in girlfriend.  He firmly believes his ex-wife was trying to poison him and was meddling with his medication boxes. He exercises at the gym one or 2 days a week for no more than an hour or 2 at a time, limited by pain and swelling in his joints   Past Medical History  Diagnosis Date  . Chronic gout 11/02/2011  . Depression 12/16/2011  . Diabetes mellitus type 2 in obese (HCC) 11/02/2011  . Edema 12/16/2011  . Hx of adenomatous colonic polyps 11/02/2011  . Hyperlipidemia LDL goal <160 11/02/2011  . Hypogonadism male 11/02/2011  . Hypothyroidism 11/02/2011  . Obesity, Class II, BMI 35-39.9, with comorbidity (HCC) 12/16/2011  .  Right C6 radiculopathy 12/16/2011    No past surgical history on file.   Current Outpatient Prescriptions  Medication Sig Dispense Refill  . albuterol (PROVENTIL HFA;VENTOLIN HFA) 108 (90 BASE) MCG/ACT inhaler Inhale 2 puffs into the lungs every 6 (six) hours as needed for wheezing. 1 Inhaler 4  . AMBULATORY NON FORMULARY MEDICATION 3 ml syringes 22 g 1 1/2 inch needles  For use with testosterone injection 100 each 11  . atorvastatin  (LIPITOR) 80 MG tablet TAKE 1 TABLET (80 MG TOTAL) BY MOUTH DAILY. 90 tablet 3  . Beclomethasone Dipropionate (QNASL) 80 MCG/ACT AERS Two sprays each nostril daily. 1 Inhaler 5  . diazepam (VALIUM) 5 MG tablet One by mouth every night to help with pain/sleep, may take one additional dose in the daytime as needed for neck pain. 60 tablet 0  . Febuxostat (ULORIC) 80 MG TABS Take 1 tablet (80 mg total) by mouth daily. 30 tablet 4  . fenofibrate (TRICOR) 145 MG tablet TAKE 1 TABLET BY MOUTH EVERY DAY 30 tablet 7  . fluticasone (FLONASE) 50 MCG/ACT nasal spray Two sprays nostril daily. 16 g 2  . glucose blood (ONETOUCH VERIO) test strip 1 each by Other route once a week. And lancets 1/week 50 each 12  . Icosapent Ethyl (VASCEPA) 1 G CAPS Take 2 capsules by mouth 2 (two) times daily with a meal. 120 capsule 5  . KLOR-CON M20 20 MEQ tablet TAKE 1 TABLET (20 MEQ TOTAL) BY MOUTH 2 (TWO) TIMES DAILY. 180 tablet 1  . levothyroxine (SYNTHROID, LEVOTHROID) 137 MCG tablet TAKE 1 TABLET (137 MCG TOTAL) BY MOUTH DAILY. 90 tablet 0  . Linaclotide (LINZESS) 145 MCG CAPS capsule Take 1 capsule (145 mcg total) by mouth daily. 14 capsule 0  . metFORMIN (GLUCOPHAGE-XR) 500 MG 24 hr tablet Take 2 tablets (1,000 mg total) by mouth daily with breakfast. 60 tablet 11  . modafinil (PROVIGIL) 200 MG tablet Take 1 tablet (200 mg total) by mouth daily. 30 tablet 1  . montelukast (SINGULAIR) 10 MG tablet TAKE 1 TABLET (10 MG TOTAL) BY MOUTH AT BEDTIME. 30 tablet 3  . omeprazole (PRILOSEC) 40 MG capsule Take 1 capsule (40 mg total) by mouth daily. 90 capsule 3  . oxyCODONE-acetaminophen (PERCOCET) 7.5-325 MG tablet Take 1 tablet by mouth 2 (two) times daily as needed (neck pain). 60 tablet 0  . phentermine (ADIPEX-P) 37.5 MG tablet Take 1 tablet (37.5 mg total) by mouth daily before breakfast. 30 tablet 0  . testosterone cypionate (DEPOTESTOSTERONE CYPIONATE) 200 MG/ML injection INJECT 1 ML EVERY 21 DAYS 10 mL 0  . topiramate  (TOPAMAX) 50 MG tablet TAKE 1 TABLET BY MOUTH 2 TIMES DAILY. 60 tablet 2  . torsemide (DEMADEX) 20 MG tablet Take 2 tablets (40 mg total) by mouth daily. One to two twice a day as needed for swelling. Discontinue furosemide. 60 tablet 3  . Vitamin D, Ergocalciferol, (DRISDOL) 50000 UNITS CAPS capsule Take 1 capsule (50,000 Units total) by mouth every 7 (seven) days. Recheck Vitamin D in 3 Months 12 capsule 0  . allopurinol (ZYLOPRIM) 300 MG tablet Take 1 tablet (300 mg total) by mouth daily. 90 tablet 3  . potassium chloride (K-DUR) 10 MEQ tablet Take 2 tablets (20 mEq total) by mouth daily. 90 tablet 0   No current facility-administered medications for this visit.    Allergies:   Effexor xr; Lexapro; Morphine and related; and Latex    Social History:  The patient  reports that he has  been smoking Cigars.  He quit smokeless tobacco use about 13 years ago. His smokeless tobacco use included Chew. He reports that he does not drink alcohol or use illicit drugs.   Family History:  The patient's family history includes Diabetes in his father; Stroke in his mother. There is no history of Thyroid disease.  His 41 year old brother has "heart disease" but [name] does not know any details   ROS:  Please see the history of present illness.     left shoulder pain, lower back pain, Otherwise, review of systems positive for none.   All other systems are reviewed and negative.    PHYSICAL EXAM: VS:  BP 120/80 mmHg  Pulse 90  Ht 5\' 11"  (1.803 m)  Wt 286 lb 6.4 oz (129.91 kg)  BMI 39.96 kg/m2 , BMI Body mass index is 39.96 kg/(m^2).  General: Alert, oriented x3, no distress.  Multiple elaborate tattoos Head: no evidence of trauma, PERRL, EOMI, no exophtalmos or lid lag, no myxedema, no xanthelasma; normal ears, nose and oropharynx Neck:  Hard to evaluate jugular venous pulsations or hepatojugular reflux due to obesity, but I suspect jugular venous pulsations are about 5-6 centimeters elevated;  brisk carotid pulses without delay and no carotid bruits Chest: clear to auscultation, no signs of consolidation by percussion or palpation, normal fremitus, symmetrical and full respiratory excursions Cardiovascular:  Unable to define the apical impulse, regular rhythm, normal first and second heart sounds, no murmurs, rubs or gallops Abdomen: no tenderness or distention, no masses by palpation, no abnormal pulsatility or arterial bruits, normal bowel sounds, no hepatosplenomegaly Extremities: no clubbing, cyanosis;  2+ symmetrical pretibialedema; 2+ radial, ulnar and brachial pulses bilaterally; 2+ right femoral, posterior tibial and dorsalis pedis pulses; 2+ left femoral, posterior tibial and dorsalis pedis pulses; no subclavian or femoral bruits Neurological: grossly nonfocal Psych: euthymic mood, full affect   EKG:  EKG is ordered today. The ekg ordered today demonstrates  Normal sinus rhythm, normal tracing   Recent Labs: 02/12/2015: ALT 45; BUN 17; Creat 1.07; Potassium 3.4*; Sodium 136 04/03/2015: TSH 2.90    Lipid Panel    Component Value Date/Time   CHOL 109 04/24/2014 1626   TRIG 226* 04/24/2014 1626   HDL 36* 04/24/2014 1626   CHOLHDL 3.0 04/24/2014 1626   VLDL 45* 04/24/2014 1626   LDLCALC 28 04/24/2014 1626      Wt Readings from Last 3 Encounters:  04/04/15 286 lb 6.4 oz (129.91 kg)  04/03/15 280 lb (127.007 kg)  03/10/15 277 lb (125.646 kg)     ASSESSMENT AND PLAN:  1. Edema -  Does not have known liver disease and does not have evidence of proteinuria.  His edema could be cardiac in etiology representing either right heart failure from ignored obstructive sleep apnea,  Pulmonary hypertension from use of drugs or cardiomyopathy of obesity. No overt signs of cardiomegaly or valvular disease are otherwise seen on exam.  check echocardiogram  discussed the fact that sodium restriction has to be an important part of combating edema. While increased sodium intake can  be compensated with higher doses of diuretic, this will likely cause more side effects due to electrolyte imbalances.  2. Mixed hyperlipidemia - he has excellent LDL cholesterol levels and does not need combined therapy with statin and WelChol. The WelChol may be contributing to his hypertriglyceridemia, although this is most related to obesity/insulin resistance/diabetes. Stop WelChol.  Continue statin.  Labs from 2013 showed triglycerides as high as 600 (even though A1c was  even lower then, compared to today), so current level is definitely an improvement  3. Type 2 diabetes mellitus well controlled,  Insulin resistant  4. Morbid obesity. I wonder whether we should reevaluate him for sleep apnea. He has gained weight since previous sleep study and his edema could be related to right heart failure. Weight loss is strongly recommended. We briefly talked about the fact that phentermine and other anorexiant drugs can be associated with valvular complications or pulmonary arterial hypertension. I think a lot of his somatic complaints could be traced back to his weight.     Current medicines are reviewed at length with the patient today.  The patient does not have concerns regarding medicines.  The following changes have been made:   Double dose of torsemide and double the dose of potassium supplement  Labs/ tests ordered today include:  Orders Placed This Encounter  Procedures  . EKG 12-Lead  . ECHOCARDIOGRAM COMPLETE    Patient Instructions  Your physician has requested that you have an echocardiogram. Echocardiography is a painless test that uses sound waves to create images of your heart. It provides your doctor with information about the size and shape of your heart and how well your heart's chambers and valves are working. This procedure takes approximately one hour. There are no restrictions for this procedure.  Your physician has recommended you make the following change in your  medication:   INCREASE TORSEMIDE TO 40 MG DAILY  INCREASE POTASSIUM TO 2 TABLETS DAILY.  CALL AND LET us KNOW WHAT STRENGTH YOUR TABLETS ARE  STOP [facility]  Dr. Royann Shivers recommends that you schedule a follow-up appointment in: 1ST AVAILABLE       Signed, Thurmon Fair, MD  04/06/2015 9:46 AM    Thurmon Fair, MD, [facility] [phone] office [phone] pager

## 2015-04-06 ENCOUNTER — Encounter: Payer: Self-pay | Admitting: Cardiovascular Disease

## 2015-04-07 ENCOUNTER — Ambulatory Visit: Admitting: Family Medicine

## 2015-04-08 ENCOUNTER — Telehealth: Payer: Self-pay | Admitting: *Deleted

## 2015-04-08 DIAGNOSIS — Z79899 Other long term (current) drug therapy: Secondary | ICD-10-CM

## 2015-04-08 NOTE — Telephone Encounter (Signed)
Patient notified Dr. Salena Saner wants BMP and mag level done in 10 days.  [place] and will have this done 04/21/15.

## 2015-04-08 NOTE — Telephone Encounter (Signed)
-----   Message from Thurmon FairMihai Croitoru, MD sent at 04/06/2015 10:16 AM EST -----  Please have him check a basic metabolic panel and magnesium in 10 days

## 2015-04-09 ENCOUNTER — Telehealth: Payer: Self-pay | Admitting: Family Medicine

## 2015-04-09 DIAGNOSIS — M542 Cervicalgia: Principal | ICD-10-CM

## 2015-04-09 DIAGNOSIS — G8929 Other chronic pain: Secondary | ICD-10-CM

## 2015-04-09 MED ORDER — OXYCODONE-ACETAMINOPHEN 7.5-325 MG PO TABS: 1.0000 | ORAL_TABLET | Freq: Two times a day (BID) | ORAL | Status: DC | PRN

## 2015-04-09 NOTE — Telephone Encounter (Signed)
Refill req 

## 2015-04-21 ENCOUNTER — Encounter: Payer: Self-pay | Admitting: Family Medicine

## 2015-04-21 ENCOUNTER — Ambulatory Visit (INDEPENDENT_AMBULATORY_CARE_PROVIDER_SITE_OTHER): Admitting: Family Medicine

## 2015-04-21 DIAGNOSIS — Z0289 Encounter for other administrative examinations: Secondary | ICD-10-CM

## 2015-04-21 NOTE — Progress Notes (Signed)
No show 04/21/2015

## 2015-04-22 ENCOUNTER — Ambulatory Visit (HOSPITAL_COMMUNITY): Attending: Cardiology

## 2015-04-22 ENCOUNTER — Telehealth (HOSPITAL_COMMUNITY): Payer: Self-pay | Admitting: Radiology

## 2015-04-22 NOTE — Telephone Encounter (Signed)
Encounter made in error. 

## 2015-04-24 DIAGNOSIS — R0989 Other specified symptoms and signs involving the circulatory and respiratory systems: Secondary | ICD-10-CM

## 2015-05-01 ENCOUNTER — Ambulatory Visit: Admitting: Cardiovascular Disease

## 2015-05-30 ENCOUNTER — Telehealth: Payer: Self-pay | Admitting: Emergency Medicine

## 2015-05-30 DIAGNOSIS — G8929 Other chronic pain: Secondary | ICD-10-CM

## 2015-05-30 DIAGNOSIS — M542 Cervicalgia: Principal | ICD-10-CM

## 2015-05-30 MED ORDER — OXYCODONE-ACETAMINOPHEN 7.5-325 MG PO TABS: 1.0000 | ORAL_TABLET | Freq: Two times a day (BID) | ORAL | Status: DC | PRN

## 2015-05-30 NOTE — Telephone Encounter (Signed)
Pt.notified

## 2015-05-30 NOTE — Telephone Encounter (Signed)
[place], Rx placed in in-box ready for pickup/faxing.  

## 2015-06-11 ENCOUNTER — Encounter: Payer: Self-pay | Admitting: Family Medicine

## 2015-06-11 ENCOUNTER — Ambulatory Visit (INDEPENDENT_AMBULATORY_CARE_PROVIDER_SITE_OTHER): Admitting: Family Medicine

## 2015-06-11 ENCOUNTER — Other Ambulatory Visit: Payer: Self-pay | Admitting: Cardiovascular Disease

## 2015-06-11 VITALS — BP 129/76 | HR 89 | Wt 281.0 lb

## 2015-06-11 DIAGNOSIS — M5416 Radiculopathy, lumbar region: Secondary | ICD-10-CM

## 2015-06-11 DIAGNOSIS — M62838 Other muscle spasm: Secondary | ICD-10-CM | POA: Diagnosis not present

## 2015-06-11 DIAGNOSIS — E119 Type 2 diabetes mellitus without complications: Secondary | ICD-10-CM

## 2015-06-11 DIAGNOSIS — E1169 Type 2 diabetes mellitus with other specified complication: Secondary | ICD-10-CM

## 2015-06-11 DIAGNOSIS — E669 Obesity, unspecified: Secondary | ICD-10-CM | POA: Diagnosis not present

## 2015-06-11 DIAGNOSIS — Z0289 Encounter for other administrative examinations: Secondary | ICD-10-CM

## 2015-06-11 LAB — POCT GLYCOSYLATED HEMOGLOBIN (HGB A1C): HEMOGLOBIN A1C: 6.8

## 2015-06-11 MED ORDER — DIAZEPAM 5 MG PO TABS: ORAL_TABLET | ORAL | Status: DC

## 2015-06-11 MED ORDER — PHENTERMINE HCL 37.5 MG PO TABS: 37.5000 mg | ORAL_TABLET | Freq: Every day | ORAL | Status: DC

## 2015-06-11 MED ORDER — METFORMIN HCL ER 500 MG PO TB24: ORAL_TABLET | ORAL | Status: DC

## 2015-06-11 MED ORDER — PREGABALIN 75 MG PO CAPS
75.0000 mg | ORAL_CAPSULE | Freq: Two times a day (BID) | ORAL | Status: DC
Start: 2015-06-11 — End: 2015-11-07

## 2015-06-11 NOTE — Progress Notes (Signed)
No show 06/11/2015

## 2015-06-11 NOTE — Progress Notes (Signed)
CC: Jeremiah Long is a 40 y.o. male is here for Hyperglycemia; body pain; Weight Gain; Dehydration; Migraine; and Medication Refill   Subjective: HPI:  FU Type 2 Diabetes: Taking  of Metformin ER daily.  No known side effects. Fasting blood sugars at home are ranging from 120-160, more so on the high end.  He's been more inactive recently due to back pain. Denies polyuria polyphagia or polydipsia.  Requesting refill on Valium, this was last prescribed in October for chronic neck pain and neck tightness. It still seems to help especially in conjunction with acupuncture. He denies sedative effect of this medication. He denies any radicular symptoms in the upper extremity is or any motor or sensory disturbances in the upper extremity.  His major complaint today is a flareup of his low back pain which was less than mild when I saw him last but now moderate in severity on a daily basis. He was prescribed gabapentin but it's not helping. He describes the pain as a burning sensation in the left low back that occasionally radiates down the left leg. Symptoms are worse when bending forward. Denies any weakness or any other sensory disturbances in the lower extremities. He was unable to get Lyrica because he is a prior authorization in the emergency room was unable to help with this.   Review Of Systems Outlined In HPI  Past Medical History  Diagnosis Date  . Chronic gout 11/02/2011  . Depression 12/16/2011  . Diabetes mellitus type 2 in obese (HCC) 11/02/2011  . Edema 12/16/2011  . Hx of adenomatous colonic polyps 11/02/2011  . Hyperlipidemia LDL goal <160 11/02/2011  . Hypogonadism male 11/02/2011  . Hypothyroidism 11/02/2011  . Obesity, Class II, BMI 35-39.9, with comorbidity (HCC) 12/16/2011  . Right C6 radiculopathy 12/16/2011    No past surgical history on file. Family History  Problem Relation Age of Onset  . Stroke Mother   . Diabetes Father   . Thyroid disease Neg Hx     Social  History   Social History  . Marital Status: Married    Spouse Name: N/A  . Number of Children: N/A  . Years of Education: N/A   Occupational History  . Not on file.   Social History Main Topics  . Smoking status: Light Tobacco Smoker    Types: Cigars  . Smokeless tobacco: Former Neurosurgeon    Types: Chew    Quit date: 05/17/2001  . Alcohol Use: No  . Drug Use: No  . Sexual Activity: Not on file   Other Topics Concern  . Not on file   Social History Narrative     Objective: BP 129/76 mmHg  Pulse 89  Wt 281 lb (127.461 kg)  Vital signs reviewed. General: Alert and Oriented, No Acute Distress HEENT: Pupils equal, round, reactive to light. Conjunctivae clear.  External ears unremarkable.  Moist mucous membranes. Lungs: Clear and comfortable work of breathing, speaking in full sentences without accessory muscle use. Cardiac: Regular rate and rhythm.  Neuro: CN II-XII grossly intact, gait normal. Extremities: No peripheral edema.  Strong peripheral pulses. Full range of motion in both lower extremities. Gait normal.  Mental Status: No depression, anxiety, nor agitation. Logical though process. Skin: Warm and dry.  Assessment & Plan: Hayk was seen today for hyperglycemia, body pain, weight gain, dehydration, migraine and medication refill.  Diagnoses and all orders for this visit:  Diabetes mellitus type 2 in obese (HCC) -     metFORMIN (GLUCOPHAGE-XR) 500 MG 24  hr tablet; Two with breakfast daily and one with dinner daily. -     POCT HgB A1C  Lumbar radiculopathy -     pregabalin (LYRICA) 75 MG capsule; Take 1 capsule (75 mg total) by mouth 2 (two) times daily.  Muscle spasm -     diazepam (VALIUM) 5 MG tablet; One by mouth every night to help with pain/sleep, may take one additional dose in the daytime as needed for neck pain.  Other orders -     phentermine (ADIPEX-P) 37.5 MG tablet; Take 1 tablet (37.5 mg total) by mouth daily before breakfast.   Type 2  diabetes: A1c of 6.8, he would like to be aggressive and get this back in the prediabetic range. Increasing metformin to include 500 mg dose at dinnertime. He also wants to be aggressive about losing weight but understandably can't dissipate much physical activity right now given his low back pain. He wants no part in prescribed beta hCG, I have let him know that I don't have experience with this but we can try phentermine again. Lumbar radiculopathy: Samples of Lyrica were provided, if beneficial call for formal prescription and prior authorization Muscle spasm: Controlled with Valium.   Return if symptoms worsen or fail to improve.

## 2015-06-12 LAB — BASIC METABOLIC PANEL
BUN: 20 mg/dL (ref 7–25)
CHLORIDE: 99 mmol/L (ref 98–110)
CO2: 29 mmol/L (ref 20–31)
Calcium: 9.1 mg/dL (ref 8.6–10.3)
Creat: 0.85 mg/dL (ref 0.60–1.35)
GLUCOSE: 177 mg/dL — AB (ref 65–99)
POTASSIUM: 3.8 mmol/L (ref 3.5–5.3)
SODIUM: 136 mmol/L (ref 135–146)

## 2015-06-12 LAB — MAGNESIUM: Magnesium: 1.8 mg/dL (ref 1.5–2.5)

## 2015-06-16 ENCOUNTER — Encounter: Admitting: Cardiovascular Disease

## 2015-06-17 ENCOUNTER — Encounter: Payer: Self-pay | Admitting: Family Medicine

## 2015-06-17 NOTE — Progress Notes (Signed)
This encounter was created in error - please disregard.

## 2015-06-19 ENCOUNTER — Encounter: Payer: Self-pay | Admitting: *Deleted

## 2015-07-04 ENCOUNTER — Other Ambulatory Visit: Payer: Self-pay

## 2015-07-04 ENCOUNTER — Ambulatory Visit (INDEPENDENT_AMBULATORY_CARE_PROVIDER_SITE_OTHER): Admitting: Endocrinology

## 2015-07-04 ENCOUNTER — Encounter: Payer: Self-pay | Admitting: Endocrinology

## 2015-07-04 VITALS — BP 132/82 | HR 81 | Temp 98.0°F | Ht 71.0 in | Wt 273.0 lb

## 2015-07-04 DIAGNOSIS — E119 Type 2 diabetes mellitus without complications: Secondary | ICD-10-CM | POA: Diagnosis not present

## 2015-07-04 DIAGNOSIS — E669 Obesity, unspecified: Secondary | ICD-10-CM | POA: Diagnosis not present

## 2015-07-04 DIAGNOSIS — E785 Hyperlipidemia, unspecified: Secondary | ICD-10-CM

## 2015-07-04 DIAGNOSIS — E1169 Type 2 diabetes mellitus with other specified complication: Secondary | ICD-10-CM

## 2015-07-04 MED ORDER — COLESEVELAM HCL 625 MG PO TABS
1875.0000 mg | ORAL_TABLET | Freq: Two times a day (BID) | ORAL | Status: DC
Start: 2015-07-04 — End: 2015-10-10

## 2015-07-04 MED ORDER — GLUCOSE BLOOD VI STRP: 1.0000 | ORAL_STRIP | Status: DC

## 2015-07-04 MED ORDER — SITAGLIPTIN PHOSPHATE 100 MG PO TABS: 100.0000 mg | ORAL_TABLET | Freq: Every day | ORAL | Status: DC

## 2015-07-04 NOTE — Patient Instructions (Addendum)
check your blood sugar once a week.  vary the time of day when you check, between before the 3 meals, and at bedtime.  also check if you have symptoms of your blood sugar being too high or too low.  please keep a record of the readings and bring it to your next appointment here (or you can bring the meter itself).  You can write it on any piece of paper.  please call us sooner if your blood sugar goes below 70, or if you have a lot of readings over 200. i have sent a prescription to your pharmacy, to add "januvia."  Please continue the same metformin and welchol. Please come back for a follow-up appointment in 3 months.   Please see our dietician "Vernona Rieger" the same day.

## 2015-07-04 NOTE — Progress Notes (Signed)
Subjective:    Patient ID: Jeremiah Long, male    DOB: 02/22/1976, 40 y.o.   MRN: [id number]  HPI  Pt returns for f/u of diabetes mellitus: DM type: 2 Dx'ed: 2016 Complications: none Therapy: metformin DKA: never Severe hypoglycemia: never Pancreatitis: never Other: he has never been on insulin Interval history: Metformin was increased last month.  pt states he feels well in general.  no cbg record, but states cbg's are well-controlled.   Past Medical History  Diagnosis Date  . Chronic gout 11/02/2011  . Depression 12/16/2011  . Diabetes mellitus type 2 in obese (HCC) 11/02/2011  . Edema 12/16/2011  . Hx of adenomatous colonic polyps 11/02/2011  . Hyperlipidemia LDL goal <160 11/02/2011  . Hypogonadism male 11/02/2011  . Hypothyroidism 11/02/2011  . Obesity, Class II, BMI 35-39.9, with comorbidity (HCC) 12/16/2011  . Right C6 radiculopathy 12/16/2011    No past surgical history on file.  Social History   Social History  . Marital Status: Married    Spouse Name: N/A  . Number of Children: N/A  . Years of Education: N/A   Occupational History  . Not on file.   Social History Main Topics  . Smoking status: Light Tobacco Smoker    Types: Cigars  . Smokeless tobacco: Former Neurosurgeon    Types: Chew    Quit date: 05/17/2001  . Alcohol Use: No  . Drug Use: No  . Sexual Activity: Not on file   Other Topics Concern  . Not on file   Social History Narrative    Current Outpatient Prescriptions on File Prior to Visit  Medication Sig Dispense Refill  . albuterol (PROVENTIL HFA;VENTOLIN HFA) 108 (90 BASE) MCG/ACT inhaler Inhale 2 puffs into the lungs every 6 (six) hours as needed for wheezing. 1 Inhaler 4  . AMBULATORY NON FORMULARY MEDICATION 3 ml syringes 22 g 1 1/2 inch needles  For use with testosterone injection 100 each 11  . atorvastatin (LIPITOR) 80 MG tablet TAKE 1 TABLET (80 MG TOTAL) BY MOUTH DAILY. 90 tablet 3  . Beclomethasone Dipropionate (QNASL) 80 MCG/ACT AERS  Two sprays each nostril daily. 1 Inhaler 5  . diazepam (VALIUM) 5 MG tablet One by mouth every night to help with pain/sleep, may take one additional dose in the daytime as needed for neck pain. 60 tablet 0  . Febuxostat (ULORIC) 80 MG TABS Take 1 tablet (80 mg total) by mouth daily. 30 tablet 4  . fenofibrate (TRICOR) 145 MG tablet TAKE 1 TABLET BY MOUTH EVERY DAY 30 tablet 7  . fluticasone (FLONASE) 50 MCG/ACT nasal spray Two sprays nostril daily. 16 g 2  . Icosapent Ethyl (VASCEPA) 1 G CAPS Take 2 capsules by mouth 2 (two) times daily with a meal. 120 capsule 5  . KLOR-CON M20 20 MEQ tablet TAKE 1 TABLET (20 MEQ TOTAL) BY MOUTH 2 (TWO) TIMES DAILY. 180 tablet 1  . levothyroxine (SYNTHROID, LEVOTHROID) 137 MCG tablet TAKE 1 TABLET (137 MCG TOTAL) BY MOUTH DAILY. 90 tablet 0  . Linaclotide (LINZESS) 145 MCG CAPS capsule Take 1 capsule (145 mcg total) by mouth daily. 14 capsule 0  . metFORMIN (GLUCOPHAGE-XR) 500 MG 24 hr tablet Two with breakfast daily and one with dinner daily. 90 tablet 2  . modafinil (PROVIGIL) 200 MG tablet Take 1 tablet (200 mg total) by mouth daily. 30 tablet 1  . montelukast (SINGULAIR) 10 MG tablet TAKE 1 TABLET (10 MG TOTAL) BY MOUTH AT BEDTIME. 30 tablet 3  .  omeprazole (PRILOSEC) 40 MG capsule Take 1 capsule (40 mg total) by mouth daily. 90 capsule 3  . oxyCODONE-acetaminophen (PERCOCET) 7.5-325 MG tablet Take 1 tablet by mouth 2 (two) times daily as needed (neck pain). 60 tablet 0  . phentermine (ADIPEX-P) 37.5 MG tablet Take 1 tablet (37.5 mg total) by mouth daily before breakfast. 30 tablet 0  . potassium chloride (K-DUR) 10 MEQ tablet Take 2 tablets (20 mEq total) by mouth daily. 90 tablet 0  . pregabalin (LYRICA) 75 MG capsule Take 1 capsule (75 mg total) by mouth 2 (two) times daily. 60 capsule 0  . testosterone cypionate (DEPOTESTOSTERONE CYPIONATE) 200 MG/ML injection INJECT 1 ML EVERY 21 DAYS 10 mL 0  . topiramate (TOPAMAX) 50 MG tablet TAKE 1 TABLET BY MOUTH 2  TIMES DAILY. 60 tablet 2  . torsemide (DEMADEX) 20 MG tablet Take 2 tablets (40 mg total) by mouth daily. One to two twice a day as needed for swelling. Discontinue furosemide. 60 tablet 3  . Vitamin D, Ergocalciferol, (DRISDOL) 50000 UNITS CAPS capsule Take 1 capsule (50,000 Units total) by mouth every 7 (seven) days. Recheck Vitamin D in 3 Months 12 capsule 0   No current facility-administered medications on file prior to visit.    Allergies  Allergen Reactions  . Effexor Xr [Venlafaxine Hcl Er]     fatigue  . Lexapro [Escitalopram Oxalate]     Panic attacks  . Morphine And Related Other (See Comments)    Migraines  . Latex Itching    Family History  Problem Relation Age of Onset  . Stroke Mother   . Diabetes Father   . Thyroid disease Neg Hx     BP 132/82 mmHg  Pulse 81  Temp(Src) 98 F (36.7 C) (Oral)  Ht  (1.803 m)  Wt 273 lb (123.832 kg)  BMI 38.09 kg/m2  SpO2 95%   Review of Systems He has lost a few lbs.      Objective:   Physical Exam VITAL SIGNS:  See vs page GENERAL: no distress Pulses: dorsalis pedis intact bilat.   MSK: no deformity of the feet CV: trace bilat leg edema.   Skin:  no ulcer on the feet.  normal color and temp on the feet. Neuro: sensation is intact to touch on the feet.    Lab Results  Component Value Date   HGBA1C 6.8 06/11/2015      Assessment & Plan:  DM: he needs increased rx, if it can be done with a regimen that avoids or minimizes hypoglycemia.   Patient is advised the following: Patient Instructions  check your blood sugar once a week.  vary the time of day when you check, between before the 3 meals, and at bedtime.  also check if you have symptoms of your blood sugar being too high or too low.  please keep a record of the readings and bring it to your next appointment here (or you can bring the meter itself).  You can write it on any piece of paper.  please call us sooner if your blood sugar goes below 70, or if you  have a lot of readings over 200. i have sent a prescription to your pharmacy, to add "januvia."  Please continue the same metformin and welchol. Please come back for a follow-up appointment in 3 months.   Please see our dietician "Vernona Rieger" the same day.

## 2015-07-09 ENCOUNTER — Ambulatory Visit: Admitting: Family Medicine

## 2015-07-09 ENCOUNTER — Telehealth: Payer: Self-pay | Admitting: Family Medicine

## 2015-07-09 NOTE — Telephone Encounter (Signed)
Pt contacted office to request a refill on his pain medication. Pt was informed of his dismissal and went into detail on how we give emergency care for 30 days and why he was discharged. Pt states one of the dates listed for a missed appt he was out of town, explained he should have cancelled that appt prior. Pt then states the date in January 2017 he was here, advised he was over 10 min late and even though he was added back onto the PCP's schedule later that appt was schedule over 30 min after the original time he should have been in the office.  Patient was advised no refill on his pain medication would be given. Pt was very frustrated. He questioned "what do I do now?" Advised Pt to contact another primary care office and see if they take his insurance, if so he can schedule an appt to establish care. Pt states "well how do I know if they will write my pain meds?" Advised the Pt it is up to the Provider on what medications they write and that would have to be addressed at an appointment. Pt was very angry and states he wants to "file a complaint." Informed Pt I would give his information to our Engineer, manufacturing and she would contact him. Pt ended call abruptly.

## 2015-07-16 ENCOUNTER — Other Ambulatory Visit: Payer: Self-pay

## 2015-07-16 MED ORDER — BAYER CONTOUR MONITOR W/DEVICE KIT: PACK | Status: DC

## 2015-07-16 MED ORDER — GLUCOSE BLOOD VI STRP: ORAL_STRIP | Status: DC

## 2015-07-16 MED ORDER — BAYER MICROLET LANCETS MISC: Status: AC

## 2015-07-24 ENCOUNTER — Telehealth: Payer: Self-pay | Admitting: Endocrinology

## 2015-07-24 NOTE — Telephone Encounter (Signed)
I contacted the pt and left a vm advising of note below. Requested a call back from the pt once he determines which meter is covered.

## 2015-07-24 NOTE — Telephone Encounter (Signed)
please call patient: [employer] declined coverage for contour meter. Please call them, and ask what they cover, and let me know.

## 2015-08-06 ENCOUNTER — Other Ambulatory Visit: Payer: Self-pay | Admitting: Cardiovascular Disease

## 2015-08-06 ENCOUNTER — Other Ambulatory Visit: Payer: Self-pay | Admitting: Family Medicine

## 2015-08-06 NOTE — Telephone Encounter (Signed)
REFILL 

## 2015-08-25 ENCOUNTER — Encounter: Payer: Self-pay | Admitting: Family Medicine

## 2015-08-25 ENCOUNTER — Ambulatory Visit (INDEPENDENT_AMBULATORY_CARE_PROVIDER_SITE_OTHER): Admitting: Family Medicine

## 2015-08-25 VITALS — BP 120/79 | HR 78 | Temp 97.6°F | Resp 20 | Ht 71.0 in | Wt 284.0 lb

## 2015-08-25 DIAGNOSIS — G8929 Other chronic pain: Secondary | ICD-10-CM | POA: Diagnosis not present

## 2015-08-25 DIAGNOSIS — Z7189 Other specified counseling: Secondary | ICD-10-CM

## 2015-08-25 DIAGNOSIS — R600 Localized edema: Secondary | ICD-10-CM | POA: Diagnosis not present

## 2015-08-25 DIAGNOSIS — M542 Cervicalgia: Secondary | ICD-10-CM

## 2015-08-25 DIAGNOSIS — M1A9XX0 Chronic gout, unspecified, without tophus (tophi): Secondary | ICD-10-CM

## 2015-08-25 DIAGNOSIS — M1611 Unilateral primary osteoarthritis, right hip: Secondary | ICD-10-CM

## 2015-08-25 DIAGNOSIS — E039 Hypothyroidism, unspecified: Secondary | ICD-10-CM

## 2015-08-25 DIAGNOSIS — Z7689 Persons encountering health services in other specified circumstances: Secondary | ICD-10-CM

## 2015-08-25 DIAGNOSIS — E291 Testicular hypofunction: Secondary | ICD-10-CM

## 2015-08-25 DIAGNOSIS — E559 Vitamin D deficiency, unspecified: Secondary | ICD-10-CM

## 2015-08-25 DIAGNOSIS — M5412 Radiculopathy, cervical region: Secondary | ICD-10-CM

## 2015-08-25 DIAGNOSIS — Z86010 Personal history of colonic polyps: Secondary | ICD-10-CM

## 2015-08-25 DIAGNOSIS — Z0000 Encounter for general adult medical examination without abnormal findings: Secondary | ICD-10-CM

## 2015-08-25 MED ORDER — OXYCODONE-ACETAMINOPHEN 7.5-325 MG PO TABS: 1.0000 | ORAL_TABLET | Freq: Two times a day (BID) | ORAL | Status: DC | PRN

## 2015-08-25 NOTE — Patient Instructions (Signed)
Pain  management referral today. I will provide 1 month script of narcotic only. I do not manage chronic pain.  Must see cardiologist and get ECHO completed, all recommendations on cardiology meds should come from them.  Please have your labs forwarded to us, so I can review and follow up with you to discuss concerns.
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
## 2015-08-26 ENCOUNTER — Encounter: Payer: Self-pay | Admitting: Family Medicine

## 2015-08-26 DIAGNOSIS — G8929 Other chronic pain: Secondary | ICD-10-CM | POA: Insufficient documentation

## 2015-08-26 DIAGNOSIS — Z79899 Other long term (current) drug therapy: Secondary | ICD-10-CM | POA: Insufficient documentation

## 2015-09-12 ENCOUNTER — Telehealth: Payer: Self-pay | Admitting: *Deleted

## 2015-09-12 NOTE — Telephone Encounter (Signed)
Pt already has referral into new provider for pain management. It does take some time to get into new pain referrals. They are reviewing his records.

## 2015-09-12 NOTE — Telephone Encounter (Signed)
Patient called and states he missed his appt with pain management scheduled in [place]. Patient is requesting an appt for a different pain management Dr. He states Dr Claiborne BillingsKuneff had recommended someone at his last visit. Please advise.

## 2015-09-16 ENCOUNTER — Other Ambulatory Visit (HOSPITAL_COMMUNITY)

## 2015-09-18 ENCOUNTER — Ambulatory Visit (HOSPITAL_COMMUNITY)

## 2015-09-22 ENCOUNTER — Other Ambulatory Visit: Payer: Self-pay

## 2015-09-22 ENCOUNTER — Ambulatory Visit (HOSPITAL_COMMUNITY): Attending: Cardiovascular Disease

## 2015-09-22 DIAGNOSIS — Z720 Tobacco use: Secondary | ICD-10-CM | POA: Insufficient documentation

## 2015-09-22 DIAGNOSIS — R0602 Shortness of breath: Secondary | ICD-10-CM | POA: Diagnosis not present

## 2015-09-22 DIAGNOSIS — E785 Hyperlipidemia, unspecified: Secondary | ICD-10-CM | POA: Diagnosis not present

## 2015-09-22 DIAGNOSIS — Z6839 Body mass index (BMI) 39.0-39.9, adult: Secondary | ICD-10-CM | POA: Insufficient documentation

## 2015-09-22 DIAGNOSIS — R0600 Dyspnea, unspecified: Secondary | ICD-10-CM | POA: Diagnosis present

## 2015-09-22 DIAGNOSIS — E119 Type 2 diabetes mellitus without complications: Secondary | ICD-10-CM | POA: Diagnosis not present

## 2015-09-24 ENCOUNTER — Ambulatory Visit: Admitting: Family Medicine

## 2015-09-24 ENCOUNTER — Telehealth: Payer: Self-pay | Admitting: *Deleted

## 2015-09-24 NOTE — Telephone Encounter (Signed)
patient called left message asking if we received letter from pain management and also inquiring about ECHO results. Called patient phone left message we received records request from pain management. ECHO results are to be reviewed with patient by cardiology at his appt on 09/25/15 per cardiology note they were the ordering physician.

## 2015-09-25 ENCOUNTER — Ambulatory Visit: Admitting: Physician Assistant

## 2015-09-25 ENCOUNTER — Ambulatory Visit: Admitting: Family Medicine

## 2015-09-26 ENCOUNTER — Encounter: Payer: Self-pay | Admitting: Family Medicine

## 2015-09-26 ENCOUNTER — Ambulatory Visit (INDEPENDENT_AMBULATORY_CARE_PROVIDER_SITE_OTHER): Admitting: Family Medicine

## 2015-09-26 VITALS — BP 115/76 | HR 77 | Temp 98.3°F | Resp 20 | Wt 282.5 lb

## 2015-09-26 DIAGNOSIS — M1611 Unilateral primary osteoarthritis, right hip: Secondary | ICD-10-CM

## 2015-09-26 DIAGNOSIS — G8929 Other chronic pain: Secondary | ICD-10-CM | POA: Diagnosis not present

## 2015-09-26 DIAGNOSIS — R600 Localized edema: Secondary | ICD-10-CM

## 2015-09-26 DIAGNOSIS — E291 Testicular hypofunction: Secondary | ICD-10-CM

## 2015-09-26 DIAGNOSIS — E119 Type 2 diabetes mellitus without complications: Secondary | ICD-10-CM

## 2015-09-26 DIAGNOSIS — E1169 Type 2 diabetes mellitus with other specified complication: Secondary | ICD-10-CM

## 2015-09-26 DIAGNOSIS — M542 Cervicalgia: Secondary | ICD-10-CM

## 2015-09-26 DIAGNOSIS — E669 Obesity, unspecified: Secondary | ICD-10-CM

## 2015-09-26 MED ORDER — OXYCODONE-ACETAMINOPHEN 7.5-325 MG PO TABS: 1.0000 | ORAL_TABLET | Freq: Two times a day (BID) | ORAL | Status: AC | PRN

## 2015-09-26 NOTE — Progress Notes (Signed)
Patient ID: Jeremiah Long, male   DOB: 1976/01/03, 40 y.o.   MRN: [id number]    Jeremiah Long , [birth year], 40 y.o., male MRN: [id number]  CC: Chronic pain Subjective:  Agent presents for acute office visit with multiple complaints her being addressed by other providers.  Chronic pain: Patient states he is not thrilled with the new pain management clinic because they are offering more alternative therapies. He states they're not going to give him any narcotics until after his next office visit when a half-time review the pain management. He states they are asking his primary care provider to supply another month of narcotics to bridge him. Patient had been prescribed oxycodone 7.5-325 by his prior PCP for arthritis/C6 radiculopathy left hip pain. Patient is new to this practice, and told that I will not be managing his chronic pain.  Edema: Patient reports edema that is in his hands and lower extremities. He is very difficult to obtain a timeline of the onset of this edema. He does have a cardiologist in which is working him up with echocardiograms and evaluation for his edema. He brings this up today but then later discusses that he has a follow-up appointment with his cardiologist this coming week to discuss the edema again. Patient is on testosterone supplementation discussed with him sometimes  supplementation can cause edema. This seems to be a long-standing chronic issue, that he cannot seem to attribute it was there prior or after testosterone use. He states it might of been there prior but worse with testosterone use.   Hypogonadism: Patient had an odd history of hypogonadism with testosterone supplementation. Patient has had repeated low testosterone levels. At one time it looks like he was on antiestrogen medication and a very high testosterone supplement dose. Patient administers his own testosterone IM injections, and states he is down prescribe testosterone cypionate 200 mg 1 mL  every 21 days. Patient has not had repeat testosterone levels. Last hemoglobin 13.9 and hematocrit of 41.2 08/14/2015. Testosterone at that time collected 11 AM was 137. Cholesterol panel: Total cholesterol 161, triglycerides 364, HDL 30, LDL 58, VLDL 73. Last PSA appears to be July 2015 0.41.  Allergies  Allergen Reactions  . Other Other (See Comments)    fatigue  . Effexor Xr [Venlafaxine Hcl Er]     fatigue  . Lexapro [Escitalopram Oxalate]     Panic attacks  . Morphine And Related Other (See Comments)    Migraines  . Latex Itching   Social History  Substance Use Topics  . Smoking status: Light Tobacco Smoker    Types: Cigars  . Smokeless tobacco: Former Systems developer    Types: Chew    Quit date: 05/17/2001  . Alcohol Use: No   Past Medical History  Diagnosis Date  . Chronic gout 11/02/2011  . Depression 12/16/2011  . Diabetes mellitus type 2 in obese ([place]) 11/02/2011  . Edema 12/16/2011  . Hx of adenomatous colonic polyps 11/02/2011  . Hyperlipidemia LDL goal <160 11/02/2011  . Hypogonadism male 11/02/2011  . Hypothyroidism 11/02/2011  . Obesity, Class II, BMI 35-39.9, with comorbidity ([place]) 12/16/2011  . Right C6 radiculopathy 12/16/2011  . Allergy   . Anemia   . Arthritis   . Asthma    Past Surgical History  Procedure Laterality Date  . Tonsillectomy    . Shoulder surgery Left   . Spine surgery  2009    "decompression"   Family History  Problem Relation Age of Onset  . Stroke Mother   .  Arthritis Mother   . Hearing loss Mother   . Diabetes Father   . Early death Father 77    Diabetes complications  . Thyroid disease Neg Hx   . Diabetes Sister      Medication List       This list is accurate as of: 09/26/15 11:13 AM.  Always use your most recent med list.               albuterol 108 (90 Base) MCG/ACT inhaler  Commonly known as:  PROVENTIL HFA;VENTOLIN HFA  Inhale 2 puffs into the lungs every 6 (six) hours as needed for wheezing.     AMBULATORY NON FORMULARY  MEDICATION  3 ml syringes 22 g 1 1/2 inch needles  For use with testosterone injection     atorvastatin 80 MG tablet  Commonly known as:  LIPITOR  TAKE 1 TABLET (80 MG TOTAL) BY MOUTH DAILY.     BAYER CONTOUR MONITOR w/Device Kit  Use to check blood sugar 1 time per day.     BAYER MICROLET LANCETS lancets  Use to check blood sugar 1 time per day.     colesevelam 625 MG tablet  Commonly known as:  WELCHOL  Take 3 tablets (1,875 mg total) by mouth 2 (two) times daily with a meal.     diazepam 5 MG tablet  Commonly known as:  VALIUM  One by mouth every night to help with pain/sleep, may take one additional dose in the daytime as needed for neck pain.     Febuxostat 80 MG Tabs  Commonly known as:  ULORIC  Take 1 tablet (80 mg total) by mouth daily.     fenofibrate 145 MG tablet  Commonly known as:  TRICOR  TAKE 1 TABLET BY MOUTH EVERY DAY     glucose blood test strip  Commonly known as:  BAYER CONTOUR TEST  Use to check blood sugar 1 time per day.     Icosapent Ethyl 1 g Caps  Commonly known as:  VASCEPA  Take 2 capsules by mouth 2 (two) times daily with a meal.     KLOR-CON M20 20 MEQ tablet  Generic drug:  potassium chloride SA  TAKE 1 TABLET (20 MEQ TOTAL) BY MOUTH 2 (TWO) TIMES DAILY.     levothyroxine 137 MCG tablet  Commonly known as:  SYNTHROID, LEVOTHROID  TAKE 1 TABLET (137 MCG TOTAL) BY MOUTH DAILY.     metFORMIN 500 MG 24 hr tablet  Commonly known as:  GLUCOPHAGE-XR  Two with breakfast daily and one with dinner daily.     montelukast 10 MG tablet  Commonly known as:  SINGULAIR  TAKE 1 TABLET (10 MG TOTAL) BY MOUTH AT BEDTIME.     omeprazole 40 MG capsule  Commonly known as:  PRILOSEC  Take 1 capsule (40 mg total) by mouth daily.     oxyCODONE-acetaminophen 7.5-325 MG tablet  Commonly known as:  PERCOCET  Take 1 tablet by mouth 2 (two) times daily as needed (neck pain).     potassium chloride 10 MEQ tablet  Commonly known as:  K-DUR  Take 2 tablets  (20 mEq total) by mouth daily.     pregabalin 75 MG capsule  Commonly known as:  LYRICA  Take 1 capsule (75 mg total) by mouth 2 (two) times daily.     sitaGLIPtin 100 MG tablet  Commonly known as:  JANUVIA  Take 1 tablet (100 mg total) by mouth daily.  testosterone cypionate 200 MG/ML injection  Commonly known as:  DEPOTESTOSTERONE CYPIONATE  INJECT 1 ML EVERY 21 DAYS     topiramate 50 MG tablet  Commonly known as:  TOPAMAX  TAKE 1 TABLET BY MOUTH 2 TIMES DAILY.     torsemide 20 MG tablet  Commonly known as:  DEMADEX  TAKE 2 TABLETS BY MOUTH ONCE TO TWICE DAILY AS NEEDED FOR SWELLING     Vitamin D (Ergocalciferol) 50000 units Caps capsule  Commonly known as:  DRISDOL  Take 1 capsule (50,000 Units total) by mouth every 7 (seven) days. Recheck Vitamin D in 3 Months         ROS: Negative, with the exception of above mentioned in HPI   Objective:  BP 115/76 mmHg  Pulse 77  Temp(Src) 98.3 F (36.8 C)  Resp 20  Wt 282 lb 8 oz (128.141 kg)  SpO2 95% Body mass index is 39.42 kg/(m^2). Gen: Afebrile. No acute distress. Nontoxic in appearance, well-developed, well-nourished, needs constant redirection. HENT: AT. [place]. MMM, no oral lesions.  Eyes:Pupils Equal Round Reactive to light, Extraocular movements intact,  Conjunctiva without redness, discharge or icterus. Neck/lymp/endocrine: Supple, no lymphadenopathy CV: RRR, trace edema bilateral lower extremity Chest: CTAB, no wheeze or crackles. Good air movement, normal resp effort.  Abd: Soft. NTND. BS present  Neuro: Normal gait. PERLA. EOMi. Alert. Oriented x3  Psych: Needs constant redirection.  Normal speech. Normal thought content and judgment.   Assessment/Plan: TAQUAN BRALLEY is a 40 y.o. male present for OV for  Chronic neck pain/Chronic pain/Osteoarthritis of right hip, unspecified osteoarthritis type: Chronic pain follow-up at [facility] June 25 - Reiterated to patient that I will not manage his chronic  pain. He will need to establish with the pain clinic for any further refills. The medication refill I have provided him today will be the last refill and this is only to bridge him. - Discussed with patient that many chronic pain clinics do not like to prescribe narcotics to someone who is on a benzodiazepine so he should consider to try to discontinue the Valium if he is able to taper off. Discussed with him they will also be monitoring urine samples for any medications/drugs that are present on a routine basis. He is aware of this and is willing to comply with her regulations. - oxyCODONE-acetaminophen (PERCOCET) 7.5-325 MG tablet; Take 1 tablet by mouth 2 (two) times daily as needed (neck pain).  Dispense: 60 tablet; Refill: 0  Hypogonadism male/Diabetes mellitus type 2 in obese [facility]): Endocrine follow-up on May 19 - Discussed with patient to speak to his endocrinologist about taking over his management of his hypogonadism. Our clinic routinely performs the IM injection to make certain patient is receiving injections properly in a timely fashion. Review of his most recent labs available his hemoglobin and hematocrit is within parameters. His lipid profile has greatly improved since prior collections that were recorded. Patient will need updated testosterone levels, collected between 8 and 10 AM. - Patient appears to need a updated PSA, noticed after blood collection was completed. - Testosterone; Future - Testosterone Total,Free,Bio, Males; Future - If endocrine is not taking over management of his hypogonadism, we will continue to monitor here however patient was strongly encouraged to have eye injections completed in the office. He would need to adhere to routine labs for PSA, CBC, testosterone and lipid panel monitoring per guidelines. - Patient to continue with endocrine for diabetes management  Localized edema: Cardiology follow-up on May 26 -  Patient to continue with cardiology for full  evaluation of edema. Would consider testosterone as a potential cause of increased edema. We'll obtain levels today to evaluate levels. -  If cardiology does not suspect edema is related CV, and endocrine doesn't suspect it's related to the testosterone supplementation, would consider rheumatology referral.  > 25 minutes spent with patient, >50% of time spent face to face counseling patient and coordinating care.    electronically signed by:  Howard Pouch, DO  [place]

## 2015-09-26 NOTE — Patient Instructions (Signed)
Pain meds filled.  Schedule testosterone for Monday between 8-10am .

## 2015-09-29 ENCOUNTER — Other Ambulatory Visit (INDEPENDENT_AMBULATORY_CARE_PROVIDER_SITE_OTHER)

## 2015-09-29 DIAGNOSIS — E291 Testicular hypofunction: Secondary | ICD-10-CM

## 2015-09-29 DIAGNOSIS — Z79899 Other long term (current) drug therapy: Secondary | ICD-10-CM | POA: Diagnosis not present

## 2015-09-29 LAB — BASIC METABOLIC PANEL
BUN: 10 mg/dL (ref 7–25)
CHLORIDE: 102 mmol/L (ref 98–110)
CO2: 26 mmol/L (ref 20–31)
CREATININE: 0.9 mg/dL (ref 0.60–1.35)
Calcium: 9.2 mg/dL (ref 8.6–10.3)
Glucose, Bld: 166 mg/dL — ABNORMAL HIGH (ref 65–99)
Potassium: 3.5 mmol/L (ref 3.5–5.3)
Sodium: 138 mmol/L (ref 135–146)

## 2015-09-30 ENCOUNTER — Other Ambulatory Visit: Payer: Self-pay | Admitting: Cardiovascular Disease

## 2015-09-30 ENCOUNTER — Other Ambulatory Visit: Payer: Self-pay | Admitting: Family Medicine

## 2015-09-30 LAB — TESTOSTERONE TOTAL,FREE,BIO, MALES
ALBUMIN: 3.8 g/dL (ref 3.6–5.1)
Sex Hormone Binding: 16 nmol/L (ref 10–50)
TESTOSTERONE: 409 ng/dL (ref 250–827)
Testosterone, Bioavailable: 173.3 ng/dL (ref 130.5–681.7)
Testosterone, Free: 98.9 pg/mL (ref 47.0–244.0)

## 2015-09-30 LAB — TESTOSTERONE: TESTOSTERONE: 409 ng/dL (ref 250–827)

## 2015-09-30 NOTE — Telephone Encounter (Signed)
REFILL 

## 2015-10-01 NOTE — Telephone Encounter (Signed)
Please call pt: - his testosterone is ~400. This is a good level. Continue current regimen and we discussed him also speaking to his endocrine about managing his low T.  - Of note: in review of all his prior PCP records, I was unable to find a PSA more current than 2015, this needs to be completed yearly with testosterone supplmentation. If endocrine takes over testosterone management, they can order, otherwise we will need to collect a PSA at his next OV/CPE appt here.

## 2015-10-02 ENCOUNTER — Other Ambulatory Visit: Payer: Self-pay

## 2015-10-02 MED ORDER — SITAGLIPTIN PHOSPHATE 100 MG PO TABS: 100.0000 mg | ORAL_TABLET | Freq: Every day | ORAL | Status: DC

## 2015-10-02 MED ORDER — TORSEMIDE 20 MG PO TABS: 20.0000 mg | ORAL_TABLET | Freq: Two times a day (BID) | ORAL | Status: DC

## 2015-10-03 ENCOUNTER — Encounter: Payer: Self-pay | Admitting: Endocrinology

## 2015-10-03 ENCOUNTER — Ambulatory Visit (INDEPENDENT_AMBULATORY_CARE_PROVIDER_SITE_OTHER): Admitting: Endocrinology

## 2015-10-03 ENCOUNTER — Encounter: Attending: Endocrinology | Admitting: Dietician

## 2015-10-03 ENCOUNTER — Encounter: Payer: Self-pay | Admitting: Dietician

## 2015-10-03 VITALS — BP 136/82 | HR 80 | Temp 98.0°F | Wt 283.1 lb

## 2015-10-03 VITALS — Ht 71.0 in | Wt 283.0 lb

## 2015-10-03 DIAGNOSIS — E119 Type 2 diabetes mellitus without complications: Secondary | ICD-10-CM

## 2015-10-03 DIAGNOSIS — E669 Obesity, unspecified: Secondary | ICD-10-CM | POA: Diagnosis not present

## 2015-10-03 LAB — POCT GLYCOSYLATED HEMOGLOBIN (HGB A1C): Hemoglobin A1C: 6.8

## 2015-10-03 MED ORDER — GLUCOSE BLOOD VI STRP: 1.0000 | ORAL_STRIP | Freq: Every day | Status: AC

## 2015-10-03 NOTE — Patient Instructions (Addendum)
Consider taking 1000 units of Vitamin D when you are finished with the large doses. Discuss this with your doctor. Be as active as possible.  Consider swimming.  Discuss this with your doctor. Be informed about your food choices when eating out.  Go to [employer] or google the restaurant name and nutrition. Continue following a low sodium diet. Avoid skipping meals Increase your non starchy vegetable intake to 1/2 of your plate. Read the label on your lemonade.  Be sure it is zero carbohydrates.  Aim for 4 Carb Choices per meal (60 grams) +/- 1 either way  Aim for 0-1 Carbs per snack if hungry  Include protein in moderation with your meals and snacks Consider reading food labels for Total Carbohydrate and Fat Grams of foods Consider checking BG at alternate times per day as directed by MD  Consider taking medication as directed by MD

## 2015-10-03 NOTE — Patient Instructions (Addendum)
check your blood sugar once a week.  vary the time of day when you check, between before the 3 meals, and at bedtime.  also check if you have symptoms of your blood sugar being too high or too low.  please keep a record of the readings and bring it to your next appointment here (or you can bring the meter itself).  You can write it on any piece of paper.  please call us sooner if your blood sugar goes below 70, or if you have a lot of readings over 200. Please change testosterone to 100 mg every 2 weeks.   Please come back for a follow-up appointment in 3-4 months.   Here is a new meter.  i have sent a prescription to your pharmacy, for strips.

## 2015-10-03 NOTE — Progress Notes (Signed)
Pre visit review using our clinic review tool, if applicable. No additional management support is needed unless otherwise documented below in the visit note. 

## 2015-10-03 NOTE — Progress Notes (Signed)
Subjective:    Patient ID: Jeremiah Long, male    DOB: March 14, 1976, 40 y.o.   MRN: [id number]  HPI Pt returns for f/u of diabetes mellitus: DM type: 2 Dx'ed: 2016 Complications: none Therapy: 3 oral meds DKA: never Severe hypoglycemia: never Pancreatitis: never Other: he has never been on insulin Interval history: pt states he feels well in general.  He does not check cbg's.  He takes meds as rx'ed.   Pt reports he had puberty at the normal age.  He has no biological children, and does not want fertility.  He says he has never taken illicit androgens.  He has been on depo-testosterone since 2012.  He says he never had evaluation to see why he needed this.  He does not take antiandrogens.  He denies any h/o infertility, XRT, or genital infection.  He has never had surgery, or a serious injury to the head or genital area.  He does not consume alcohol excessively.  His last dose was 2 weeks ago.  He had a trial off rx in late 2016.  F/u testosterone level was in the 100's (after being off x 5 months). Past Medical History  Diagnosis Date  . Chronic gout 11/02/2011  . Depression 12/16/2011  . Diabetes mellitus type 2 in obese ([facility]) 11/02/2011  . Edema 12/16/2011  . Hx of adenomatous colonic polyps 11/02/2011  . Hyperlipidemia LDL goal <160 11/02/2011  . Hypogonadism male 11/02/2011  . Hypothyroidism 11/02/2011  . Obesity, Class II, BMI 35-39.9, with comorbidity ([facility]) 12/16/2011  . Right C6 radiculopathy 12/16/2011  . Allergy   . Anemia   . Arthritis   . Asthma     Past Surgical History  Procedure Laterality Date  . Tonsillectomy    . Shoulder surgery Left   . Spine surgery  2009    "decompression"    Social History   Social History  . Marital Status: Married    Spouse Name: N/A  . Number of Children: N/A  . Years of Education: N/A   Occupational History  . Not on file.   Social History Main Topics  . Smoking status: Light Tobacco Smoker    Types: Cigars  . Smokeless  tobacco: Former Neurosurgeon    Types: Chew    Quit date: 05/17/2001  . Alcohol Use: No  . Drug Use: No  . Sexual Activity: Yes    Birth Control/ Protection: None   Other Topics Concern  . Not on file   Social History Narrative   Divorced.    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   He is originally from [place] and has served in the armed forces. 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    He exercises at the gym one or 2 days a week for no more than an hour or 2 at a time, limited by pain and swelling in his joints.   Drinks caffeine, uses herbal remedies, takes a daily vitamin.   Wears his seatbelt, smoke detector in the morning, firearms in the home.   Feels safe in his relationships.   Patient has received psychiatric counseling and last 5 years.       Current Outpatient Prescriptions on File Prior to Visit  Medication Sig Dispense Refill  . albuterol (PROVENTIL HFA;VENTOLIN HFA) 108 (90 BASE) MCG/ACT inhaler Inhale 2 puffs into the lungs every 6 (six)  hours as needed for wheezing. 1 Inhaler 4  . AMBULATORY NON FORMULARY MEDICATION 3 ml syringes 22 g 1 1/2 inch needles  For use with testosterone injection 100 each 11  . atorvastatin (LIPITOR) 80 MG tablet TAKE 1 TABLET (80 MG TOTAL) BY MOUTH DAILY. 90 tablet 3  . BAYER MICROLET LANCETS lancets Use to check blood sugar 1 time per day. 100 each 12  . colesevelam (WELCHOL) 625 MG tablet Take 3 tablets (1,875 mg total) by mouth 2 (two) times daily with a meal. (Patient not taking: Reported on 10/03/2015) 180 tablet 6  . diazepam (VALIUM) 5 MG tablet One by mouth every night to help with pain/sleep, may take one additional dose in the daytime as needed for neck pain. 60 tablet 0  . Febuxostat (ULORIC) 80 MG TABS Take 1 tablet (80 mg total) by mouth daily. 30 tablet 4  . fenofibrate (TRICOR) 145 MG  tablet TAKE 1 TABLET BY MOUTH EVERY DAY 30 tablet 7  . Icosapent Ethyl (VASCEPA) 1 G CAPS Take 2 capsules by mouth 2 (two) times daily with a meal. (Patient not taking: Reported on 10/03/2015) 120 capsule 5  . KLOR-CON M20 20 MEQ tablet TAKE 1 TABLET (20 MEQ TOTAL) BY MOUTH 2 (TWO) TIMES DAILY. 180 tablet 1  . levothyroxine (SYNTHROID, LEVOTHROID) 137 MCG tablet TAKE 1 TABLET (137 MCG TOTAL) BY MOUTH DAILY. 90 tablet 0  . metFORMIN (GLUCOPHAGE-XR) 500 MG 24 hr tablet Two with breakfast daily and one with dinner daily. 90 tablet 2  . montelukast (SINGULAIR) 10 MG tablet TAKE 1 TABLET (10 MG TOTAL) BY MOUTH AT BEDTIME. 30 tablet 3  . omeprazole (PRILOSEC) 40 MG capsule Take 1 capsule (40 mg total) by mouth daily. 90 capsule 3  . oxyCODONE-acetaminophen (PERCOCET) 7.5-325 MG tablet Take 1 tablet by mouth 2 (two) times daily as needed (neck pain). 60 tablet 0  . potassium chloride (K-DUR) 10 MEQ tablet Take 2 tablets (20 mEq total) by mouth daily. 90 tablet 0  . pregabalin (LYRICA) 75 MG capsule Take 1 capsule (75 mg total) by mouth 2 (two) times daily. 60 capsule 0  . sitaGLIPtin (JANUVIA) 100 MG tablet Take 1 tablet (100 mg total) by mouth daily. 90 tablet 3  . topiramate (TOPAMAX) 50 MG tablet TAKE 1 TABLET BY MOUTH 2 TIMES DAILY. 60 tablet 2  . torsemide (DEMADEX) 20 MG tablet Take 1 tablet (20 mg total) by mouth 2 (two) times daily. 180 tablet 0  . Vitamin D, Ergocalciferol, (DRISDOL) 50000 UNITS CAPS capsule Take 1 capsule (50,000 Units total) by mouth every 7 (seven) days. Recheck Vitamin D in 3 Months 12 capsule 0   No current facility-administered medications on file prior to visit.    Allergies  Allergen Reactions  . Other Other (See Comments)    fatigue  . Effexor Xr [Venlafaxine Hcl Er]     fatigue  . Lexapro [Escitalopram Oxalate]     Panic attacks  . Morphine And Related Other (See Comments)    Migraines  . Latex Itching    Family History  Problem Relation Age of Onset  .  Stroke Mother   . Arthritis Mother   . Hearing loss Mother   . Diabetes Father   . Early death Father 9    Diabetes complications  . Thyroid disease Neg Hx   . Diabetes Sister     BP 136/82 mmHg  Pulse 80  Temp(Src) 98 F (36.7 C) (Oral)  Wt 283 lb 2 oz (  128.425 kg)  SpO2 95%   Review of Systems Denies decreased urinary stream    Objective:   Physical Exam VITAL SIGNS:  See vs page GENERAL: no distress Pulses: dorsalis pedis intact bilat.   MSK: no deformity of the feet.  CV: trace bilat leg edema.  Skin:  no ulcer on the feet.  normal color and temp on the feet. Neuro: sensation is intact to touch on the feet.   Lab Results  Component Value Date   HGBA1C 6.8 10/03/2015      Assessment & Plan:  DM: worse again.  Low testosterone, on rx.   Patient is advised the following: Patient Instructions  check your blood sugar once a week.  vary the time of day when you check, between before the 3 meals, and at bedtime.  also check if you have symptoms of your blood sugar being too high or too low.  please keep a record of the readings and bring it to your next appointment here (or you can bring the meter itself).  You can write it on any piece of paper.  please call us sooner if your blood sugar goes below 70, or if you have a lot of readings over 200. Please change testosterone to 100 mg every 2 weeks.   Please come back for a follow-up appointment in 3-4 months.   Here is a new meter.  i have sent a prescription to your pharmacy, for strips.     addendum: i reviewed controlled substance hx, so i advised pt to d/c testosterone injections.
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
## 2015-10-07 ENCOUNTER — Telehealth: Payer: Self-pay

## 2015-10-07 NOTE — Telephone Encounter (Signed)
Marzella SchleinAngela M Truitt, CMA  Thurmon FairMihai Croitoru, MD           His joints and hands are swollen and painful. He was referred to you from the rheumatologist.  Stated that they told him he "only has slight carpal tunnel in both hands, this doesn't bother me near as bad as the swelling and pain."  Reassured patient that his echo was normal.  He has an upcoming appointment next week (5/26) with Judie GrieveBryan.       Previous Messages     ----- Message -----   From: Thurmon FairMihai Croitoru, MD   Sent: 09/30/2015  5:37 PM    To: Marzella SchleinAngela M Truitt, CMA   Swelling from heart diease (which would respond to torsemide) does not usually involve the hands, but occurs in a dependent fashion in the legs. I'm not sure that more diuretic is the answer. Are the joints swollen or tender? Has he ever had the advice of a rheumatologist?  MCr  ----- Message -----   From: Marzella SchleinAngela M Truitt, CMA   Sent: 09/30/2015  4:40 PM    To: Thurmon FairMihai Croitoru, MD   Called patient with results.  Patient is concerned with the swelling in his hands and ankles. "Can't close a fist due to swelling." Otherwise, no complaints.  Patient would like to increase his fluid pill (Torsemide 20 mg) to 2 tablets twice daily.

## 2015-10-10 ENCOUNTER — Ambulatory Visit (INDEPENDENT_AMBULATORY_CARE_PROVIDER_SITE_OTHER): Admitting: Physician Assistant

## 2015-10-10 ENCOUNTER — Encounter: Payer: Self-pay | Admitting: Physician Assistant

## 2015-10-10 VITALS — BP 100/76 | HR 72 | Ht 71.0 in | Wt 288.0 lb

## 2015-10-10 DIAGNOSIS — R601 Generalized edema: Secondary | ICD-10-CM

## 2015-10-10 DIAGNOSIS — E119 Type 2 diabetes mellitus without complications: Secondary | ICD-10-CM

## 2015-10-10 DIAGNOSIS — IMO0001 Reserved for inherently not codable concepts without codable children: Secondary | ICD-10-CM

## 2015-10-10 DIAGNOSIS — E669 Obesity, unspecified: Secondary | ICD-10-CM

## 2015-10-10 DIAGNOSIS — E1169 Type 2 diabetes mellitus with other specified complication: Secondary | ICD-10-CM

## 2015-10-10 DIAGNOSIS — E785 Hyperlipidemia, unspecified: Secondary | ICD-10-CM | POA: Diagnosis not present
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
## 2015-10-10 NOTE — Patient Instructions (Signed)
Please increased lasix to 40mg  in the AM and 20mg  PM for 2 days  Weigh yourself daily for 3 days to keep track of weight and see if your weight has come down/output has increased  Your physician recommends that you schedule a follow-up appointment with your primary care MD  Your physician wants you to follow-up in: 6 months with Dr. Royann Shiversroitoru. You will receive a reminder letter in the mail two months in advance. If you don't receive a letter, please call our office to schedule the follow-up appointment.

## 2015-11-07 ENCOUNTER — Other Ambulatory Visit: Payer: Self-pay | Admitting: Family Medicine

## 2015-11-07 ENCOUNTER — Encounter: Payer: Self-pay | Admitting: Family Medicine

## 2015-11-07 ENCOUNTER — Other Ambulatory Visit: Payer: Self-pay | Admitting: Cardiovascular Disease

## 2015-11-07 ENCOUNTER — Other Ambulatory Visit: Payer: Self-pay | Admitting: *Deleted

## 2015-11-07 DIAGNOSIS — M62838 Other muscle spasm: Secondary | ICD-10-CM

## 2015-11-07 DIAGNOSIS — M5416 Radiculopathy, lumbar region: Secondary | ICD-10-CM

## 2015-11-07 DIAGNOSIS — J4520 Mild intermittent asthma, uncomplicated: Secondary | ICD-10-CM

## 2015-11-07 MED ORDER — ATORVASTATIN CALCIUM 80 MG PO TABS: ORAL_TABLET | ORAL | Status: AC

## 2015-11-07 MED ORDER — DIAZEPAM 5 MG PO TABS: ORAL_TABLET | ORAL | Status: DC

## 2015-11-07 MED ORDER — POTASSIUM CHLORIDE ER 10 MEQ PO TBCR: 20.0000 meq | EXTENDED_RELEASE_TABLET | Freq: Every day | ORAL | Status: DC

## 2015-11-07 MED ORDER — ALBUTEROL SULFATE HFA 108 (90 BASE) MCG/ACT IN AERS: 2.0000 | INHALATION_SPRAY | Freq: Four times a day (QID) | RESPIRATORY_TRACT | Status: AC | PRN

## 2015-11-07 MED ORDER — MONTELUKAST SODIUM 10 MG PO TABS: ORAL_TABLET | ORAL | Status: DC

## 2015-11-07 MED ORDER — PREGABALIN 75 MG PO CAPS: 75.0000 mg | ORAL_CAPSULE | Freq: Two times a day (BID) | ORAL | Status: DC

## 2015-11-07 MED ORDER — LEVOTHYROXINE SODIUM 137 MCG PO TABS: ORAL_TABLET | ORAL | Status: DC

## 2015-11-07 MED ORDER — TORSEMIDE 20 MG PO TABS: 20.0000 mg | ORAL_TABLET | Freq: Two times a day (BID) | ORAL | Status: DC

## 2015-11-07 NOTE — Telephone Encounter (Signed)
Patient called Jeremiah Long is requesting refills on his medications. Last office visit 09/26/15 . Most of these medications we have not filled before.

## 2015-11-07 NOTE — Telephone Encounter (Signed)
Pt dismissed from Practice, no refills.

## 2015-11-07 NOTE — Addendum Note (Signed)
Addended by: Lindell SparELKINS, JENNA M on: 11/07/2015 05:10 PM   Modules accepted: Orders

## 2015-11-07 NOTE — Telephone Encounter (Signed)
Pt dismissed from Practice, no refills.  

## 2015-11-19 ENCOUNTER — Telehealth: Payer: Self-pay | Admitting: Family Medicine

## 2015-11-19 NOTE — Telephone Encounter (Signed)
Patient is requesting an xray of his L hip & then follow up visit with Dr. Claiborne BillingsKuneff to discuss the possibility of a referral to ortho. He would prefer to have the xray at [facility]. Please contact patient.

## 2015-11-19 NOTE — Telephone Encounter (Signed)
I have never seen pt for hip pain. I would not order an imaging study until evaluated.

## 2015-11-20 NOTE — Telephone Encounter (Signed)
Spoke with patient scheduled an appt for evaluation of left hip pain.

## 2015-11-21 ENCOUNTER — Ambulatory Visit (INDEPENDENT_AMBULATORY_CARE_PROVIDER_SITE_OTHER)

## 2015-11-21 ENCOUNTER — Encounter: Payer: Self-pay | Admitting: Family Medicine

## 2015-11-21 ENCOUNTER — Telehealth: Payer: Self-pay | Admitting: Family Medicine

## 2015-11-21 ENCOUNTER — Ambulatory Visit (INDEPENDENT_AMBULATORY_CARE_PROVIDER_SITE_OTHER): Admitting: Family Medicine

## 2015-11-21 VITALS — BP 109/74 | HR 70 | Temp 97.9°F | Resp 20 | Wt 267.2 lb

## 2015-11-21 DIAGNOSIS — M25552 Pain in left hip: Secondary | ICD-10-CM | POA: Diagnosis not present

## 2015-11-21 MED ORDER — MELOXICAM 15 MG PO TABS: 15.0000 mg | ORAL_TABLET | Freq: Every day | ORAL | Status: DC

## 2015-11-21 NOTE — Progress Notes (Signed)
Patient ID: Jeremiah Long, male   DOB: 11/20/1975, 40 y.o.   MRN: [id number]    Jeremiah Long , 03/18/1976, 40 y.o., male MRN: [id number] Patient Care Team    Relationship Specialty Notifications Start End  Natalia Leatherwoodenee A Rogen Porte, DO PCP - General Family Medicine  08/25/15   Romero BellingSean Ellison, MD Consulting Physician Endocrinology  11/21/15   Thurmon FairMihai Croitoru, MD Consulting Physician Cardiology  11/21/15   Donnetta HailJames F Beekman, MD Consulting Physician Rheumatology  11/21/15     CC: Hip pain Subjective: Pt presents for an acute OV with complaints of left hip pain of 6 months duration.  Patient states he had a fall at that time and has had progressively worsening pain since. He reports the last 2 weeks the pain has worsened and is now daily. He was not seen for that fall. He does not have imaging studies of hip in the system. He states he miss tepped on steps and then fell down 2 steps on the hip.  Pt feels symptoms are worse at night, with squatting , flexion of hip and is constant. He feels it is worth with moist weather.  Pt has tried percocet  to ease their symptoms. Patient is established with a pain specialist. He states advil helps with this pain more than the percocet. He points to his lateral left hip. Some days he states it makes him limp. Pt has a h/o osteoarthritis of his right hip and had been [facility] with [facility]  Allergies  Allergen Reactions  . Other Other (See Comments)    fatigue  . Effexor Xr [Venlafaxine Hcl Er]     fatigue  . Lexapro [Escitalopram Oxalate]     Panic attacks  . Morphine And Related Other (See Comments)    Migraines  . Latex Itching   Social History  Substance Use Topics  . Smoking status: Light Tobacco Smoker    Types: Cigars  . Smokeless tobacco: Former NeurosurgeonUser    Types: Chew    Quit date: 05/17/2001  . Alcohol Use: No   Past Medical History  Diagnosis Date  . Chronic gout 11/02/2011  . Depression 12/16/2011  . Diabetes  mellitus type 2 in obese (HCC) 11/02/2011  . Edema 12/16/2011  . Hx of adenomatous colonic polyps 11/02/2011  . Hyperlipidemia LDL goal <160 11/02/2011  . Hypogonadism male 11/02/2011  . Hypothyroidism 11/02/2011  . Obesity, Class II, BMI 35-39.9, with comorbidity (HCC) 12/16/2011  . Right C6 radiculopathy 12/16/2011  . Allergy   . Anemia   . Arthritis   . Asthma    Past Surgical History  Procedure Laterality Date  . Tonsillectomy    . Shoulder surgery Left   . Spine surgery  2009    "decompression"   Family History  Problem Relation Age of Onset  . Stroke Mother   . Arthritis Mother   . Hearing loss Mother   . Diabetes Father   . Early death Father [id number]    Diabetes complications  . Thyroid disease Neg Hx   . Diabetes Sister      Medication List       This list is accurate as of: 11/21/15  9:35 AM.  Always use your most recent med list.               albuterol 108 (90 Base) MCG/ACT inhaler  Commonly known as:  PROVENTIL HFA;VENTOLIN HFA  Inhale 2 puffs into the lungs every 6 (six) hours as needed for  wheezing.     AMBULATORY NON FORMULARY MEDICATION  3 ml syringes 22 g 1 1/2 inch needles  For use with testosterone injection     atorvastatin 80 MG tablet  Commonly known as:  LIPITOR  TAKE 1 TABLET (80 MG TOTAL) BY MOUTH DAILY.     BAYER MICROLET LANCETS lancets  Use to check blood sugar 1 time per day.     diazepam 5 MG tablet  Commonly known as:  VALIUM  One by mouth every night to help with pain/sleep, may take one additional dose in the daytime as needed for neck pain.     Febuxostat 80 MG Tabs  Commonly known as:  ULORIC  Take 1 tablet (80 mg total) by mouth daily.     fenofibrate 145 MG tablet  Commonly known as:  TRICOR  TAKE 1 TABLET BY MOUTH EVERY DAY     glucose blood test strip  Commonly known as:  FREESTYLE LITE  1 each by Other route daily. And lancets 1/day     Icosapent Ethyl 1 g Caps  Commonly known as:  VASCEPA  Take 2 capsules by mouth 2 (two)  times daily with a meal.     levothyroxine 137 MCG tablet  Commonly known as:  SYNTHROID, LEVOTHROID  TAKE 1 TABLET (137 MCG TOTAL) BY MOUTH DAILY.     metFORMIN 500 MG 24 hr tablet  Commonly known as:  GLUCOPHAGE-XR  Two with breakfast daily and one with dinner daily.     montelukast 10 MG tablet  Commonly known as:  SINGULAIR  TAKE 1 TABLET (10 MG TOTAL) BY MOUTH AT BEDTIME.     omeprazole 40 MG capsule  Commonly known as:  PRILOSEC  Take 1 capsule (40 mg total) by mouth daily.     oxyCODONE-acetaminophen 7.5-325 MG tablet  Commonly known as:  PERCOCET  Take 1 tablet by mouth 2 (two) times daily as needed (neck pain).     potassium chloride 10 MEQ tablet  Commonly known as:  K-DUR  Take 2 tablets (20 mEq total) by mouth daily.     pregabalin 75 MG capsule  Commonly known as:  LYRICA  Take 1 capsule (75 mg total) by mouth 2 (two) times daily.     sitaGLIPtin 100 MG tablet  Commonly known as:  JANUVIA  Take 1 tablet (100 mg total) by mouth daily.     topiramate 50 MG tablet  Commonly known as:  TOPAMAX  TAKE 1 TABLET BY MOUTH 2 TIMES DAILY.     torsemide 20 MG tablet  Commonly known as:  DEMADEX  Take 1 tablet (20 mg total) by mouth 2 (two) times daily.     Vitamin D-3 1000 units Caps  Take by mouth.        No results found for this or any previous visit (from the past 24 hour(s)). No results found.   ROS: Negative, with the exception of above mentioned in HPI   Objective:  BP 109/74 mmHg  Pulse 70  Temp(Src) 97.9 F (36.6 C)  Resp 20  Wt 267 lb 4 oz (121.224 kg)  SpO2 97% Body mass index is 37.29 kg/(m^2). Gen: Afebrile. No acute distress. Nontoxic in appearance, well developed, well nourished.  HENT: AT. [place].MMM, no oral lesions.  Eyes:Pupils Equal Round Reactive to light, Extraocular movements intact,  Conjunctiva without redness, discharge or icterus. MSK: No erythema, no soft tissue swelling. Mild TTP ant hip joint, discomfort with external  rotation left hip. Limited internal  rotation bilateral hips. Neg SLE bilateral. Postive FABRE left. NV intact distally.  Neuro: mild limp. PERLA. EOMi. Alert. Oriented x3 Cranial nerves II through XII intact. Muscle strength 5/5 bilateral Lower extremity. DTRs equal bilaterally. Psych: Normal affect, dress and demeanor. Normal speech. Normal thought content and judgment.  Assessment/Plan: Jeremiah Long is a 40 y.o. male present for acute OV for  Left hip pain - C- pattern distribution of discomfort at left hip concerning for hip impingement.  - DG HIP UNILAT WITH PELVIS MIN 4 VIEWS LEFT; Future - meloxicam (MOBIC) 15 MG tablet; Take 1 tablet (15 mg total) by mouth daily.  Dispense: 30 tablet; Refill: 3 - consider ortho referral  - F/U dependent on xray result and response to mobic daily.     > 25 minutes spent with patient, >50% of time spent face to face counseling patient and coordinating care.  electronically signed by:  Felix Pacini, DO  [facility] [place]

## 2015-11-21 NOTE — Telephone Encounter (Signed)
Spoke with patient reviewed xray results and information with patient. Patient verbalized understanding.

## 2015-11-21 NOTE — Patient Instructions (Signed)
I have called mobic for you to take daily with food.  I have ordered the xray for you  And [facility]. We will call once we have the results.  Hip Pain Your hip is the joint between your upper legs and your lower pelvis. The bones, cartilage, tendons, and muscles of your hip joint perform a lot of work each day supporting your body weight and allowing you to move around. Hip pain can range from a minor ache to severe pain in one or both of your hips. Pain may be felt on the inside of the hip joint near the groin, or the outside near the buttocks and upper thigh. You may have swelling or stiffness as well.  HOME CARE INSTRUCTIONS   Take medicines only as directed by your health care provider.  Apply ice to the injured area:  Put ice in a plastic bag.  Place a towel between your skin and the bag.  Leave the ice on for 15-20 minutes at a time, 3-4 times a day.  Keep your leg raised (elevated) when possible to lessen swelling.  Avoid activities that cause pain.  Follow specific exercises as directed by your health care provider.  Sleep with a pillow between your legs on your most comfortable side.  Record how often you have hip pain, the location of the pain, and what it feels like. SEEK MEDICAL CARE IF:   You are unable to put weight on your leg.  Your hip is red or swollen or very tender to touch.  Your pain or swelling continues or worsens after 1 week.  You have increasing difficulty walking.  You have a fever. SEEK IMMEDIATE MEDICAL CARE IF:   You have fallen.  You have a sudden increase in pain and swelling in your hip. MAKE SURE YOU:   Understand these instructions.  Will watch your condition.  Will get help right away if you are not doing well or get worse.   This information is not intended to replace advice given to you by your health care provider. Make sure you discuss any questions you have with your health care provider.   Document Released:  10/21/2009 Document Revised: 05/24/2014 Document Reviewed: 12/28/2012 Elsevier Interactive Patient Education [employer]2016 Elsevier Inc.

## 2015-11-21 NOTE — Telephone Encounter (Signed)
Please call pt: - his hip xray is normal. He has mild arthritic changes in his hip, which is consistent with prior xray of his other hip.  - Mobic as prescribed daily should help with arthrtiris pain.

## 2015-11-27 ENCOUNTER — Telehealth: Payer: Self-pay | Admitting: Family Medicine

## 2015-11-27 DIAGNOSIS — M25552 Pain in left hip: Secondary | ICD-10-CM

## 2015-11-27 NOTE — Telephone Encounter (Signed)
Patient's hip pain is not getting any better. Please call him.

## 2015-11-27 NOTE — Telephone Encounter (Signed)
Left message with information on patient voice mail 

## 2015-11-27 NOTE — Telephone Encounter (Signed)
Referred to orthopedics

## 2015-12-05 ENCOUNTER — Telehealth: Payer: Self-pay | Admitting: Family Medicine

## 2015-12-05 ENCOUNTER — Other Ambulatory Visit (HOSPITAL_BASED_OUTPATIENT_CLINIC_OR_DEPARTMENT_OTHER): Payer: Self-pay | Admitting: Orthopedic Surgery

## 2015-12-05 DIAGNOSIS — M25552 Pain in left hip: Secondary | ICD-10-CM

## 2015-12-06 ENCOUNTER — Ambulatory Visit (HOSPITAL_BASED_OUTPATIENT_CLINIC_OR_DEPARTMENT_OTHER)
Admission: RE | Admit: 2015-12-06 | Discharge: 2015-12-06 | Disposition: A | Discharge status: Home / Self Care | Source: Ambulatory Visit | Attending: Orthopedic Surgery | Admitting: Orthopedic Surgery

## 2015-12-06 DIAGNOSIS — M5136 Other intervertebral disc degeneration, lumbar region: Secondary | ICD-10-CM | POA: Insufficient documentation

## 2015-12-06 DIAGNOSIS — M25552 Pain in left hip: Secondary | ICD-10-CM | POA: Diagnosis present

## 2015-12-26 ENCOUNTER — Other Ambulatory Visit: Payer: Self-pay | Admitting: Family Medicine

## 2015-12-26 ENCOUNTER — Other Ambulatory Visit: Payer: Self-pay | Admitting: Endocrinology

## 2015-12-26 ENCOUNTER — Other Ambulatory Visit: Payer: Self-pay | Admitting: Cardiovascular Disease

## 2015-12-26 DIAGNOSIS — M62838 Other muscle spasm: Secondary | ICD-10-CM

## 2015-12-26 MED ORDER — TORSEMIDE 20 MG PO TABS: 20.0000 mg | ORAL_TABLET | Freq: Two times a day (BID) | ORAL | 0 refills | Status: DC

## 2015-12-26 MED ORDER — LEVOTHYROXINE SODIUM 137 MCG PO TABS: ORAL_TABLET | ORAL | 0 refills | Status: DC

## 2015-12-26 MED ORDER — POTASSIUM CHLORIDE ER 10 MEQ PO TBCR: 20.0000 meq | EXTENDED_RELEASE_TABLET | Freq: Every day | ORAL | 0 refills | Status: DC

## 2016-01-01 ENCOUNTER — Encounter: Admitting: Dietician

## 2016-01-02 ENCOUNTER — Ambulatory Visit: Admitting: Endocrinology

## 2016-01-02 ENCOUNTER — Encounter: Payer: PRIVATE HEALTH INSURANCE | Attending: Endocrinology | Admitting: Dietician

## 2016-01-02 DIAGNOSIS — E669 Obesity, unspecified: Secondary | ICD-10-CM | POA: Diagnosis not present

## 2016-01-02 DIAGNOSIS — E119 Type 2 diabetes mellitus without complications: Secondary | ICD-10-CM | POA: Insufficient documentation

## 2016-01-02 DIAGNOSIS — Z713 Dietary counseling and surveillance: Secondary | ICD-10-CM | POA: Diagnosis present

## 2016-01-02 DIAGNOSIS — E1169 Type 2 diabetes mellitus with other specified complication: Secondary | ICD-10-CM

## 2016-01-02 NOTE — Patient Instructions (Signed)
Great job on the changes that you have made.  Staying active.  Swimming is a great job.  Having regularly scheduled meals.  Increasing your water intake and changing your lemonade to low carbohydrate.  Making healthy food choices. What can you do instead of eat when you are feeling down?  Go outside, exercise, drink water or other things.

## 2016-01-02 NOTE — Progress Notes (Signed)
Medical Nutrition Therapy:  Appt start time: 1445 end time:  1515. Patient arrived late to the appointment due to MD appointment prior to this visit.   Assessment:  10/03/15: Primary concerns today: Patient is here alone.  He would like to better improve his health and weight. He is trying to eat more protein, adding breakfast, and decreased sodium intake.  He has had type 2 diabetes since age 40 or 1 per patient.  He states that he has always been overweight. Other hx includes hypothyroidism, vitamin D deficiency, and hyperlipidemia. Patient reports that his tryglyceridesl was 1200 in the past and has decreased to 364.  Cholesterol 161 HDL: 30 and LDL 58 08/01/15.  Vitamin D was 13.5 as well on that date.  He has stopped a couple of the cholesterol medications due to increased swelling in his hands and other places.  His last HgtA1C was 6.8% 09/28/15 increased form 5.7 about 6 weeks ago at his general MD.  He checks his blood sugar once per day and it is 120-160.  He is going to resume high doses of vitamin D.  Drug seeking behavior per MD.  He has been referred to a pain clinic.  He reports being on Fentramine (diet pill)  for 1 month but MD would not renew and told him that he should not be taking this medication.  Patient lives with his girlfriend on and off.  He is currently getting a divorce.  Both patient and girlfriend share the shopping and cooking.  He is currently unemployment and just got his bachelors degree in Public relations account executiveengineering.  Used to work as a Emergency planning/management officerpolice officer.  Weight hx: 230 lbs at age 40.   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 Highest weight almost 300 lbs Today's weight 283 lbs.  He reports a 50 lb weight gain in the past year.  01/02/16: Patient is here today for follow up.  He wants to know what to do if his blood sugar is high.  He is checking his blood sugar once daily in the morning.   It was 103 recently. He reports taking his medication for diabetes appropriately and without  problems. Weight today 270 lbs is down from 183 lbs 3 months ago but patient stated that he has regained from 255 lbs.  Reports mild depression which he thinks is due to low testosterone.  He has started to swim, is choosing low carb beverages most of the time, and is trying to avoid skipping meals.    Preferred Learning Style:   No preference indicated   Learning Readiness:   Ready  MEDICATIONS: see list to include glucophage-XR and Januvia   DIETARY INTAKE: Avoided foods include fried food.  Tries not to eat after 7 or just fruit or salad.  24-hr recall:  B ( AM): 3 eggs, 1 slice [employer] toast OR greek yogurt, nuts, fruit OR high protein cereal with whole milk or almond milk, fruit Snk ( AM): none  L (1 PM): chicken, vegetables OR ham sandwich and fruit Snk ( PM): none D (6-9 PM): chicken, vegetables, brown rice Snk ( PM): occasional grapes and cheese (small amounts) Beverages: water, diet lemonade  Usual physical activity: swimming 2-3 days per week  Estimated energy needs: 2000 calories 225 g carbohydrates 125 g protein 67 g fat  Progress Towards Goal(s):  In progress.   Nutritional Diagnosis:  NB-1.1 Food and nutrition-related knowledge deficit As related to balance of carbohydrate, protein, and fat.  As evidenced by frequent skipping  meals, patient report and diet hx..    Intervention: 10/03/15 Nutrition counseling and diabetes education continued.  Encouraged him to continue with positive lifestyle change. Discussed alternatives to eating with stress. Discussed occasionally checking his blood sugar 2 hours after lunch or dinner to give a better idea of glucose control.   Discussed increasing water if his blood sugar is high and evaluate reason for high blood sugar.  Great job on the changes that you have made.  Staying active.  Swimming is a great job.  Having regularly scheduled meals.  Increasing your water intake and changing your lemonade to low  carbohydrate.  Making healthy food choices. What can you do instead of eat when you are feeling down?  Go outside, exercise, drink water or other things.  Aim for 4 Carb Choices per meal (60 grams) +/- 1 either way  Aim for 0-1 Carbs per snack if hungry  Include protein in moderation with your meals and snacks Consider reading food labels for Total Carbohydrate and Fat Grams of foods Consider checking BG at alternate times per day as directed by MD  Consider taking medication as directed by MD  Teaching Method Utilized:  Visual Auditory Hands on  Handouts given during visit include: Living Well with Diabetes Carb Counting and Food Label handouts Meal Plan Card Label reading  Barriers to learning/adherence to lifestyle change: none  Demonstrated degree of understanding via:  Teach Back   Monitoring/Evaluation:  Dietary intake, exercise, and body weight in 3 month(s).

## 2016-01-05 ENCOUNTER — Encounter: Payer: Self-pay | Admitting: Endocrinology

## 2016-01-05 ENCOUNTER — Ambulatory Visit (INDEPENDENT_AMBULATORY_CARE_PROVIDER_SITE_OTHER): Admitting: Family Medicine

## 2016-01-05 ENCOUNTER — Ambulatory Visit: Payer: Self-pay | Admitting: Family Medicine

## 2016-01-05 ENCOUNTER — Ambulatory Visit (INDEPENDENT_AMBULATORY_CARE_PROVIDER_SITE_OTHER): Admitting: Endocrinology

## 2016-01-05 ENCOUNTER — Encounter: Payer: Self-pay | Admitting: Family Medicine

## 2016-01-05 VITALS — BP 108/78 | HR 80 | Ht 71.0 in | Wt 266.0 lb

## 2016-01-05 VITALS — BP 119/79 | HR 79 | Temp 98.5°F | Resp 20 | Ht 71.0 in | Wt 266.0 lb

## 2016-01-05 DIAGNOSIS — E039 Hypothyroidism, unspecified: Secondary | ICD-10-CM | POA: Diagnosis not present

## 2016-01-05 DIAGNOSIS — E291 Testicular hypofunction: Secondary | ICD-10-CM

## 2016-01-05 DIAGNOSIS — E1169 Type 2 diabetes mellitus with other specified complication: Secondary | ICD-10-CM

## 2016-01-05 DIAGNOSIS — E119 Type 2 diabetes mellitus without complications: Secondary | ICD-10-CM | POA: Diagnosis not present

## 2016-01-05 DIAGNOSIS — M5416 Radiculopathy, lumbar region: Secondary | ICD-10-CM

## 2016-01-05 DIAGNOSIS — E669 Obesity, unspecified: Secondary | ICD-10-CM

## 2016-01-05 DIAGNOSIS — M62838 Other muscle spasm: Secondary | ICD-10-CM

## 2016-01-05 LAB — TSH: TSH: 1.45 u[IU]/mL (ref 0.35–4.50)

## 2016-01-05 LAB — POCT GLYCOSYLATED HEMOGLOBIN (HGB A1C): Hemoglobin A1C: 6.3

## 2016-01-05 MED ORDER — DIAZEPAM 5 MG PO TABS: ORAL_TABLET | ORAL | 2 refills | Status: DC

## 2016-01-05 MED ORDER — METFORMIN HCL ER 500 MG PO TB24
ORAL_TABLET | ORAL | 3 refills | Status: DC
Start: 2016-01-05 — End: 2016-12-13

## 2016-01-05 MED ORDER — PREGABALIN 75 MG PO CAPS: 75.0000 mg | ORAL_CAPSULE | Freq: Two times a day (BID) | ORAL | 3 refills | Status: DC

## 2016-01-05 NOTE — Progress Notes (Signed)
Subjective:    Patient ID: Jeremiah Long, male    DOB: 1975-07-22, 40 y.o.   MRN: [id number]  HPI Pt returns for f/u of diabetes mellitus: DM type: 2 Dx'ed: 2016 Complications: none Therapy: 2 oral meds DKA: never Severe hypoglycemia: never Pancreatitis: never Other: he has never been on insulin.   Interval history: no cbg record, but states cbg's are well-controlled.  pt states he feels well in general, except for fatigue. Pt also returns for f/u of hypogonadism (dx'ed 2012; uncertain etiology; he has no biological children, and does not want fertility; he took depo-testosterone since 2012-2017, when it was stopped, due to h/u drug abuse, and taking multiple other controlled substances).   Past Medical History:  Diagnosis Date  . Allergy   . Anemia   . Arthritis   . Asthma   . Chronic gout 11/02/2011  . Depression 12/16/2011  . Diabetes mellitus type 2 in obese (HCC) 11/02/2011  . Edema 12/16/2011  . Hx of adenomatous colonic polyps 11/02/2011  . Hyperlipidemia LDL goal <160 11/02/2011  . Hypogonadism male 11/02/2011  . Hypothyroidism 11/02/2011  . Obesity, Class II, BMI 35-39.9, with comorbidity (HCC) 12/16/2011  . Right C6 radiculopathy 12/16/2011    Past Surgical History:  Procedure Laterality Date  . SHOULDER SURGERY Left   . SPINE SURGERY  2009   "decompression"  . TONSILLECTOMY      Social History   Social History  . Marital status: Single    Spouse name: N/A  . Number of children: N/A  . Years of education: N/A   Occupational History  . Not on file.   Social History Main Topics  . Smoking status: Light Tobacco Smoker    Types: Cigars  . Smokeless tobacco: Former Neurosurgeon    Types: Chew    Quit date: 05/17/2001  . Alcohol use No  . Drug use: No  . Sexual activity: Yes    Birth control/ protection: None   Other Topics Concern  . Not on file   Social History Narrative   Divorced.    Cigar smoker, No etoh or drugs   Executive protection/law enforcement-->  Dec 2016 received  (BS) degree in Psychiatric nurse.   He is originally from [place] and has served in the armed forces. 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    He exercises at the gym one or 2 days a week for no more than an hour or 2 at a time, limited by pain and swelling in his joints.   Drinks caffeine, uses herbal remedies, takes a daily vitamin.   Wears his seatbelt, smoke detector in the morning, firearms in the home.   Feels safe in his relationships.   Patient has received psychiatric counseling and last 5 years.       Current Outpatient Prescriptions on File Prior to Visit  Medication Sig Dispense Refill  . albuterol (PROVENTIL HFA;VENTOLIN HFA) 108 (90 Base) MCG/ACT inhaler Inhale 2 puffs into the lungs every 6 (six) hours as needed for wheezing. 1 Inhaler 4  . AMBULATORY NON FORMULARY MEDICATION 3 ml syringes 22 g 1 1/2 inch needles  For use with testosterone injection 100 each 11  . atorvastatin (LIPITOR) 80 MG tablet TAKE 1 TABLET (80 MG TOTAL) BY MOUTH DAILY. 90 tablet 3  . BAYER MICROLET LANCETS lancets Use to check blood sugar 1 time per day. 100 each 12  . Cholecalciferol (VITAMIN D-3) 1000 units CAPS Take by  mouth.    . diazepam (VALIUM) 5 MG tablet One by mouth every night to help with pain/sleep, may take one additional dose in the daytime as needed for neck pain. 60 tablet 2  . fenofibrate (TRICOR) 145 MG tablet TAKE 1 TABLET BY MOUTH EVERY DAY 30 tablet 7  . ferrous sulfate 325 (65 FE) MG EC tablet Take 325 mg by mouth daily.    Marland Kitchen. glucose blood (FREESTYLE LITE) test strip 1 each by Other route daily. And lancets 1/day 100 each 12  . Icosapent Ethyl (VASCEPA) 1 G CAPS Take 2 capsules by mouth 2 (two) times daily with a meal. 120 capsule 5  . levothyroxine (SYNTHROID, LEVOTHROID) 137 MCG tablet TAKE 1 TABLET (137 MCG TOTAL) BY MOUTH DAILY. 90 tablet 0  . magnesium 30 MG tablet Take 30 mg by mouth 2 (two) times  daily.    . montelukast (SINGULAIR) 10 MG tablet TAKE 1 TABLET (10 MG TOTAL) BY MOUTH AT BEDTIME. 30 tablet 3  . omeprazole (PRILOSEC) 40 MG capsule Take 1 capsule (40 mg total) by mouth daily. 90 capsule 3  . oxyCODONE-acetaminophen (PERCOCET) 7.5-325 MG tablet Take 1 tablet by mouth 2 (two) times daily as needed (neck pain). 60 tablet 0  . potassium chloride (K-DUR) 10 MEQ tablet Take 2 tablets (20 mEq total) by mouth daily. 90 tablet 0  . pregabalin (LYRICA) 75 MG capsule Take 1 capsule (75 mg total) by mouth 2 (two) times daily. 180 capsule 3  . sitaGLIPtin (JANUVIA) 100 MG tablet Take 1 tablet (100 mg total) by mouth daily. 90 tablet 3  . topiramate (TOPAMAX) 50 MG tablet TAKE 1 TABLET BY MOUTH 2 TIMES DAILY. 60 tablet 2  . torsemide (DEMADEX) 20 MG tablet Take 1 tablet (20 mg total) by mouth 2 (two) times daily. 180 tablet 0   No current facility-administered medications on file prior to visit.     Allergies  Allergen Reactions  . Other Other (See Comments)    fatigue  . Effexor Xr [Venlafaxine Hcl Er]     fatigue  . Lexapro [Escitalopram Oxalate]     Panic attacks  . Morphine And Related Other (See Comments)    Migraines  . Latex Itching    Family History  Problem Relation Age of Onset  . Stroke Mother   . Arthritis Mother   . Hearing loss Mother   . Diabetes Father   . Early death Father [id number]    Diabetes complications  . Diabetes Sister   . Thyroid disease Neg Hx     BP 108/78   Pulse 80   Ht 5\' 11"  (1.803 m)   Wt 266 lb (120.7 kg)   BMI 37.10 kg/m     Review of Systems He denies hypoglycemia.      Objective:   Physical Exam VITAL SIGNS:  See vs page GENERAL: no distress Pulses: dorsalis pedis intact bilat.   MSK: no deformity of the feet. CV: no leg edema Skin:  no ulcer on the feet.  normal color and temp on the feet. Neuro: sensation is intact to touch on the feet.    A1c=6.3% Lab Results  Component Value Date   TESTOSTERONE 156 (L) 01/05/2016       Assessment & Plan:  Type 2 DM: well-controlled: Please continue the same medication Hypogonadism, worse off rx Drug abuse, by hx.  In this setting, the risks of testosterone supplementation are greater than benefits.  I told pt he needs to stay off  rx

## 2016-01-05 NOTE — Patient Instructions (Signed)
I have faxed refills to your mail in pharmacy for valium and lyrica.  I will place a referral for a second opinion on your testosterone injections.

## 2016-01-05 NOTE — Patient Instructions (Addendum)
check your blood sugar once a week.  vary the time of day when you check, between before the 3 meals, and at bedtime.  also check if you have symptoms of your blood sugar being too high or too low.  please keep a record of the readings and bring it to your next appointment here (or you can bring the meter itself).  You can write it on any piece of paper.  please call us sooner if your blood sugar goes below 70, or if you have a lot of readings over 200.  Please come back for a follow-up appointment in 6 months.   

## 2016-01-05 NOTE — Progress Notes (Signed)
Jeremiah Long , 10/21/1975, 40 y.o., male MRN: [id number] Patient Care Team    Relationship Specialty Notifications Start End  Natalia Leatherwoodenee A Hanny Elsberry, DO PCP - General Family Medicine  08/25/15   Romero BellingSean Ellison, MD Consulting Physician Endocrinology  11/21/15   Thurmon FairMihai Croitoru, MD Consulting Physician Cardiology  11/21/15   Donnetta HailJames F Beekman, MD Consulting Physician Rheumatology  11/21/15     CC: medication refill.  Subjective:  Chronic pain: Patient states the he has been on oxycodone 7.5/325 for 2 years. He is also prescribed diazepam 5 mg BID PRN. He mainly takes the valium at night. He has underwent injections, acupuncture and massage therapy. He has also attempted physical therapy. He states the only thing that ever seem to help was acupuncture and the narcotics. He has now established at [facility] in [place] for his chronic pain management.  Patient has a history of C6 radiculopathy, lumbar pain and cervical pain. He also states that he is having left hip pain in he has been feeling imbalance for the last few weeks.  He endorses neuropathy that has been chronic. He states [place] [facility] [place] condition and he was told it was diabetes/nerve pain. MRI 04/11/2012:Broad-based disc osteophyte complex with mild moderate central and bilateral foraminal stenosis, worse on the right. Slight disc bulging at C6-7 without significant stenosis. He has been evaluated by rheumatology and orthopedics. Rheumatology no longer follows.  Most recently referred to ortho for hip pain and has an appointment next week with Dr. Ethelene Halamos. Pt request refills on lyrica and valium today.   Allergies  Allergen Reactions  . Other Other (See Comments)    fatigue  . Effexor Xr [Venlafaxine Hcl Er]     fatigue  . Lexapro [Escitalopram Oxalate]     Panic attacks  . Morphine And Related Other (See Comments)    Migraines  . Latex Itching   Social History  Substance Use Topics  . Smoking status:  Light Tobacco Smoker    Types: Cigars  . Smokeless tobacco: Former NeurosurgeonUser    Types: Chew    Quit date: 05/17/2001  . Alcohol use No   Past Medical History:  Diagnosis Date  . Allergy   . Anemia   . Arthritis   . Asthma   . Chronic gout 11/02/2011  . Depression 12/16/2011  . Diabetes mellitus type 2 in obese (HCC) 11/02/2011  . Edema 12/16/2011  . Hx of adenomatous colonic polyps 11/02/2011  . Hyperlipidemia LDL goal <160 11/02/2011  . Hypogonadism male 11/02/2011  . Hypothyroidism 11/02/2011  . Obesity, Class II, BMI 35-39.9, with comorbidity (HCC) 12/16/2011  . Right C6 radiculopathy 12/16/2011   Past Surgical History:  Procedure Laterality Date  . SHOULDER SURGERY Left   . SPINE SURGERY  2009   "decompression"  . TONSILLECTOMY     Family History  Problem Relation Age of Onset  . Stroke Mother   . Arthritis Mother   . Hearing loss Mother   . Diabetes Father   . Early death Father [id number]    Diabetes complications  . Diabetes Sister   . Thyroid disease Neg Hx      Medication List       Accurate as of 01/05/16 11:43 AM. Always use your most recent med list.          albuterol 108 (90 Base) MCG/ACT inhaler Commonly known as:  PROVENTIL HFA;VENTOLIN HFA Inhale 2 puffs into the lungs every 6 (six)  hours as needed for wheezing.   AMBULATORY NON FORMULARY MEDICATION 3 ml syringes 22 g 1 1/2 inch needles  For use with testosterone injection   atorvastatin 80 MG tablet Commonly known as:  LIPITOR TAKE 1 TABLET (80 MG TOTAL) BY MOUTH DAILY.   BAYER MICROLET LANCETS lancets Use to check blood sugar 1 time per day.   diazepam 5 MG tablet Commonly known as:  VALIUM One by mouth every night to help with pain/sleep, may take one additional dose in the daytime as needed for neck pain.   fenofibrate 145 MG tablet Commonly known as:  TRICOR TAKE 1 TABLET BY MOUTH EVERY DAY   ferrous sulfate 325 (65 FE) MG EC tablet Take 325 mg by mouth daily.   glucose blood test strip Commonly  known as:  FREESTYLE LITE 1 each by Other route daily. And lancets 1/day   Icosapent Ethyl 1 g Caps Commonly known as:  VASCEPA Take 2 capsules by mouth 2 (two) times daily with a meal.   levothyroxine 137 MCG tablet Commonly known as:  SYNTHROID, LEVOTHROID TAKE 1 TABLET (137 MCG TOTAL) BY MOUTH DAILY.   magnesium 30 MG tablet Take 30 mg by mouth 2 (two) times daily.   metFORMIN 500 MG 24 hr tablet Commonly known as:  GLUCOPHAGE-XR TAKE 2 TABLETS BY MOUTH WITH BREAKFAST AND 1 TABLET WITH DINNER   montelukast 10 MG tablet Commonly known as:  SINGULAIR TAKE 1 TABLET (10 MG TOTAL) BY MOUTH AT BEDTIME.   omeprazole 40 MG capsule Commonly known as:  PRILOSEC Take 1 capsule (40 mg total) by mouth daily.   oxyCODONE-acetaminophen 7.5-325 MG tablet Commonly known as:  PERCOCET Take 1 tablet by mouth 2 (two) times daily as needed (neck pain).   potassium chloride 10 MEQ tablet Commonly known as:  K-DUR Take 2 tablets (20 mEq total) by mouth daily.   pregabalin 75 MG capsule Commonly known as:  LYRICA Take 1 capsule (75 mg total) by mouth 2 (two) times daily.   sitaGLIPtin 100 MG tablet Commonly known as:  JANUVIA Take 1 tablet (100 mg total) by mouth daily.   topiramate 50 MG tablet Commonly known as:  TOPAMAX TAKE 1 TABLET BY MOUTH 2 TIMES DAILY.   torsemide 20 MG tablet Commonly known as:  DEMADEX Take 1 tablet (20 mg total) by mouth 2 (two) times daily.   Vitamin D-3 1000 units Caps Take by mouth.       No results found for this or any previous visit (from the past 24 hour(s)). No results found.   ROS: Negative, with the exception of above mentioned in HPI   Objective:  BP 119/79 (BP Location: Right Arm, Patient Position: Sitting, Cuff Size: Large)   Pulse 79   Temp 98.5 F (36.9 C)   Resp 20   Ht 5\' 11"  (1.803 m)   Wt 266 lb (120.7 kg)   SpO2 97%   BMI 37.10 kg/m  Body mass index is 37.1 kg/m. Gen: Afebrile. No acute distress. Nontoxic in  appearance, well developed, well nourished.  HENT: AT. [place].MMM Eyes:Pupils Equal Round Reactive to light, Extraocular movements intact,  Conjunctiva without redness, discharge or icterus. CV: RRR, no edema Chest: CTAB, no wheeze or crackles. Good air movement, normal resp effort.   Neuro:  Normal gait. PERLA. EOMi. Alert. Oriented x3  Psych: Normal affect, dress and demeanor. Normal speech. Normal thought content and judgment.  Assessment/Plan: Jeremiah Long is a 40 y.o. male present for acute OV  for  Muscle spasm/Lumbar radiculopathy - Continue Chronic pain management with [facility] in [place]. All narcotic scripts will be managed through [facility]. - diazepam (VALIUM) 5 MG tablet; One by mouth every night to help with pain/sleep, may take one additional dose in the daytime as needed for neck pain.  Dispense: 60 tablet; Refill: 2. Sedation precaution.  - pregabalin (LYRICA) 75 MG capsule; Take 1 capsule (75 mg total) by mouth 2 (two) times daily.  Dispense: 180 capsule; Refill: 3 - F/U every 6 mos, unless refills needed sooner and continue with pain mgmt.    Hypogonadism male - referral for second opinion  - Ambulatory referral to Endocrinology   electronically signed by:  Felix Pacini, DO  [facility] - [place]

## 2016-01-06 ENCOUNTER — Encounter: Payer: Self-pay | Admitting: Endocrinology

## 2016-01-06 LAB — TESTOSTERONE,FREE AND TOTAL
TESTOSTERONE FREE: 5.7 pg/mL — AB (ref 6.8–21.5)
TESTOSTERONE: 156 ng/dL — AB (ref 264–916)

## 2016-01-07 ENCOUNTER — Encounter: Payer: Self-pay | Admitting: Family Medicine

## 2016-01-07 ENCOUNTER — Other Ambulatory Visit (HOSPITAL_COMMUNITY): Payer: Self-pay | Admitting: Physical Medicine and Rehabilitation

## 2016-01-07 DIAGNOSIS — G894 Chronic pain syndrome: Secondary | ICD-10-CM

## 2016-01-15 ENCOUNTER — Ambulatory Visit (HOSPITAL_COMMUNITY): Admission: RE | Admit: 2016-01-15 | Payer: PRIVATE HEALTH INSURANCE | Source: Ambulatory Visit

## 2016-01-15 ENCOUNTER — Ambulatory Visit (HOSPITAL_BASED_OUTPATIENT_CLINIC_OR_DEPARTMENT_OTHER): Payer: PRIVATE HEALTH INSURANCE

## 2016-01-20 ENCOUNTER — Ambulatory Visit (HOSPITAL_COMMUNITY)
Admission: RE | Admit: 2016-01-20 | Discharge: 2016-01-20 | Disposition: A | Discharge status: Home / Self Care | Payer: PRIVATE HEALTH INSURANCE | Source: Ambulatory Visit | Attending: Physical Medicine and Rehabilitation | Admitting: Physical Medicine and Rehabilitation

## 2016-01-20 ENCOUNTER — Other Ambulatory Visit (HOSPITAL_COMMUNITY): Payer: Self-pay | Admitting: Physical Medicine and Rehabilitation

## 2016-01-20 DIAGNOSIS — G894 Chronic pain syndrome: Secondary | ICD-10-CM

## 2016-01-22 ENCOUNTER — Other Ambulatory Visit: Payer: Self-pay | Admitting: Physical Medicine and Rehabilitation

## 2016-01-22 DIAGNOSIS — R52 Pain, unspecified: Secondary | ICD-10-CM

## 2016-01-30 ENCOUNTER — Other Ambulatory Visit: Payer: PRIVATE HEALTH INSURANCE

## 2016-03-12 ENCOUNTER — Other Ambulatory Visit: Payer: Self-pay | Admitting: Family Medicine

## 2016-05-15 ENCOUNTER — Other Ambulatory Visit: Payer: Self-pay | Admitting: Cardiovascular Disease

## 2016-05-15 ENCOUNTER — Other Ambulatory Visit: Payer: Self-pay | Admitting: Family Medicine

## 2016-05-23 ENCOUNTER — Other Ambulatory Visit: Payer: Self-pay | Admitting: Family Medicine

## 2016-05-24 ENCOUNTER — Other Ambulatory Visit: Payer: Self-pay | Admitting: *Deleted

## 2016-05-24 NOTE — Telephone Encounter (Signed)
Received request for refill on Topamax. We have never filled this medication last refilled by Dr Ivan AnchorsHommel in 2016. Last seen here 01/05/16 for back pain.

## 2016-06-08 ENCOUNTER — Other Ambulatory Visit: Payer: Self-pay | Admitting: *Deleted

## 2016-06-08 NOTE — Telephone Encounter (Signed)
Pharmacy request for refill on topamax . Last refilled 10/27 by Dr Ivan AnchorsHommel do you want to Rx this. Please advise.

## 2016-06-08 NOTE — Telephone Encounter (Signed)
Of note: This medication has been requested multiple times over the last year by pharmacy/patient? And denied. This medication, by his prior PCP notes, was not current and had been discontinued. He has not had an active script (at least by this provider) since he started with us. I have never prescribed medication, nor do I know why he was prescribed medicine.  Please call pharmacy and/or Pt. This has not been an active script and will not be filled by this provider. If he feels he needs this script restarted, would would need to discuss by appt.  Please ask pharmacy to stop requesting script.

## 2016-06-09 ENCOUNTER — Encounter: Payer: Self-pay | Admitting: *Deleted

## 2016-07-07 ENCOUNTER — Ambulatory Visit: Payer: PRIVATE HEALTH INSURANCE | Admitting: Endocrinology

## 2016-07-08 ENCOUNTER — Encounter: Payer: Self-pay | Admitting: Family Medicine

## 2016-07-08 ENCOUNTER — Telehealth: Payer: Self-pay | Admitting: Family Medicine

## 2016-07-08 NOTE — Telephone Encounter (Signed)
Patient called for clarification on denied refill request dated 06/09/2016 for topirimate. Read the mychart message to him and advised a visit would likely be needed in order for this to change. He refused a visit, and asked that you call him at his mobile #

## 2016-07-08 NOTE — Telephone Encounter (Signed)
Spoke with patient he states he needed topiramate for his migraines. Explained to patient he needs appt for evaluation since we have never Rx'd this or evaluated him for migraines. Offered patient an appt for tomorrow since we have no Dr here this afternoon. Patient declined appt he states he will go to Urgent Care.

## 2016-07-28 ENCOUNTER — Other Ambulatory Visit: Payer: Self-pay | Admitting: Family Medicine

## 2016-07-28 DIAGNOSIS — K219 Gastro-esophageal reflux disease without esophagitis: Secondary | ICD-10-CM

## 2016-07-28 MED ORDER — OMEPRAZOLE 40 MG PO CPDR: 40.0000 mg | DELAYED_RELEASE_CAPSULE | Freq: Every day | ORAL | 3 refills | Status: DC

## 2016-07-28 MED ORDER — FENOFIBRATE 145 MG PO TABS: 145.0000 mg | ORAL_TABLET | Freq: Every day | ORAL | 0 refills | Status: DC

## 2016-07-28 NOTE — Telephone Encounter (Signed)
**  Remind patient they can make refill requests via MyChart**  Medication refill request (Name & Dosage): omeprazole (PRILOSEC) 40 MG capsule   fenofibrate (TRICOR) 145 MG tablet  Preferred pharmacy (Name & Address):  [facility] [place] - [address]   Other comments (if applicable):

## 2016-07-29 ENCOUNTER — Other Ambulatory Visit: Payer: Self-pay | Admitting: Cardiovascular Disease

## 2016-08-16 ENCOUNTER — Other Ambulatory Visit: Payer: Self-pay | Admitting: Family Medicine

## 2016-08-17 ENCOUNTER — Ambulatory Visit
Admission: EM | Admit: 2016-08-17 | Discharge: 2016-08-17 | Disposition: A | Discharge status: Home / Self Care | Attending: Family Medicine | Admitting: Family Medicine

## 2016-08-17 ENCOUNTER — Encounter: Payer: Self-pay | Admitting: *Deleted

## 2016-08-17 DIAGNOSIS — J309 Allergic rhinitis, unspecified: Secondary | ICD-10-CM

## 2016-08-17 DIAGNOSIS — R05 Cough: Secondary | ICD-10-CM | POA: Diagnosis not present

## 2016-08-17 DIAGNOSIS — R059 Cough, unspecified: Secondary | ICD-10-CM

## 2016-08-17 LAB — RAPID STREP SCREEN (MED CTR MEBANE ONLY): STREPTOCOCCUS, GROUP A SCREEN (DIRECT): NEGATIVE

## 2016-08-17 MED ORDER — BENZONATATE 200 MG PO CAPS: 200.0000 mg | ORAL_CAPSULE | Freq: Three times a day (TID) | ORAL | 0 refills | Status: DC | PRN

## 2016-08-17 MED ORDER — MONTELUKAST SODIUM 10 MG PO TABS: 10.0000 mg | ORAL_TABLET | Freq: Every day | ORAL | 0 refills | Status: DC

## 2016-08-17 NOTE — ED Triage Notes (Signed)
PAtient started having symptom of sore throat, fever, nasal congestion, and body aches 4 days ago.

## 2016-08-17 NOTE — Discharge Instructions (Signed)
Take medication as prescribed. Rest. Drink plenty of fluids.  ° °Follow up with your primary care physician this week as needed. Return to Urgent care for new or worsening concerns.  ° °

## 2016-08-17 NOTE — ED Provider Notes (Signed)
[facility] ____________________________________________  Time seen: Approximately 3:00 PM  I have reviewed the triage vital signs and the nursing notes.  HISTORY  Chief Complaint Generalized Body Aches; Nasal Congestion; and Cough  HPI Jeremiah Long is a 41 y.o. male presenting for the complaints of 3-4 days of runny nose, nasal congestion, sore throat, some body aches and sinus pressure. States intermittent cough is a dry hacking cough. Denies hemoptysis. Reports mild sore throat at this time. States some ear congestion sensation. States symptoms onset after being outside. Does report chronic history of seasonal allergies and is not currently on his daily medicine. Reports that he normally takes Singulair however he has not seen his doctor for refill prescription. States has not had a Singulair for several weeks. Patient states that he has felt like he has had some fevers, but has measured and denies known fevers. Reports overall continues to eat and drink well. Reports continues to remain active and work except for was sent home from work today.  Denies chest pain, shortness of breath, abdominal pain, dysuria, or rash. Denies recent sickness. Denies recent antibiotic use. Denies renal insufficiency. Denies cardiac history.  Felix Pacini, DO: PCP   Past Medical History:  Diagnosis Date  . Allergy   . Anemia   . Arthritis   . Asthma   . Chronic gout 11/02/2011  . Depression 12/16/2011  . Diabetes mellitus type 2 in obese (HCC) 11/02/2011  . Edema 12/16/2011  . Hx of adenomatous colonic polyps 11/02/2011  . Hyperlipidemia LDL goal <160 11/02/2011  . Hypogonadism male 11/02/2011  . Hypothyroidism 11/02/2011  . Obesity, Class II, BMI 35-39.9, with comorbidity 12/16/2011  . Right C6 radiculopathy 12/16/2011    Patient Active Problem List   Diagnosis Date Noted  . Lumbar radiculopathy 01/05/2016  . Muscle spasm 01/05/2016  . Left hip pain 11/21/2015  . Chronic pain  08/26/2015  . Health care maintenance 08/26/2015  . Fatigue 10/16/2014  . Acne keloidalis 08/07/2014  . Vitamin D deficiency 07/16/2013  . Morbid obesity (HCC) 04/23/2013  . Stress and adjustment reaction 01/29/2013  . Suicidal ideation 01/23/2013  . Hypertriglyceridemia 12/22/2012  . Osteoarthritis of right hip 08/10/2012  . Insomnia 07/25/2012  . Right C6 radiculopathy 12/16/2011  . Obesity, Class II, BMI 35-39.9, with comorbidity 12/16/2011  . Edema 12/16/2011  . Depression 12/16/2011  . Muscle pain, cervical 11/02/2011  . Diabetes mellitus type 2 in obese (HCC) 11/02/2011  . Hypogonadism male 11/02/2011  . Chronic gout 11/02/2011  . Hx of adenomatous colonic polyps 11/02/2011  . Hyperlipidemia LDL goal <160 11/02/2011  . Hypothyroidism 11/02/2011    Past Surgical History:  Procedure Laterality Date  . SHOULDER SURGERY Left   . SPINE SURGERY  2009   "decompression"  . TONSILLECTOMY       No current facility-administered medications for this encounter.   Current Outpatient Prescriptions:  .  atorvastatin (LIPITOR) 80 MG tablet, TAKE 1 TABLET (80 MG TOTAL) BY MOUTH DAILY., Disp: 90 tablet, Rfl: 3 .  Cholecalciferol (VITAMIN D-3) 1000 units CAPS, Take by mouth., Disp: , Rfl:  .  diazepam (VALIUM) 5 MG tablet, One by mouth every night to help with pain/sleep, may take one additional dose in the daytime as needed for neck pain., Disp: 60 tablet, Rfl: 2 .  fenofibrate (TRICOR) 145 MG tablet, Take 1 tablet (145 mg total) by mouth daily., Disp: 30 tablet, Rfl: 0 .  ferrous sulfate 325 (65 FE) MG EC tablet, Take 325 mg by  mouth daily., Disp: , Rfl:  .  Icosapent Ethyl (VASCEPA) 1 G CAPS, Take 2 capsules by mouth 2 (two) times daily with a meal., Disp: 120 capsule, Rfl: 5 .  KLOR-CON 10 10 MEQ tablet, TAKE 2 TABLETS DAILY, Disp: 90 tablet, Rfl: 3 .  metFORMIN (GLUCOPHAGE-XR) 500 MG 24 hr tablet, TAKE 2 TABLETS BY MOUTH WITH BREAKFAST AND 1 TABLET WITH DINNER, Disp: 270 tablet,  Rfl: 3 .  omeprazole (PRILOSEC) 40 MG capsule, Take 1 capsule (40 mg total) by mouth daily., Disp: 90 capsule, Rfl: 3 .  oxyCODONE-acetaminophen (PERCOCET) 7.5-325 MG tablet, Take 1 tablet by mouth 2 (two) times daily as needed (neck pain)., Disp: 60 tablet, Rfl: 0 .  pregabalin (LYRICA) 75 MG capsule, Take 1 capsule (75 mg total) by mouth 2 (two) times daily., Disp: 180 capsule, Rfl: 3 .  sitaGLIPtin (JANUVIA) 100 MG tablet, Take 1 tablet (100 mg total) by mouth daily., Disp: 90 tablet, Rfl: 3 .  SYNTHROID 137 MCG tablet, TAKE 1 TABLET DAILY, Disp: 90 tablet, Rfl: 0 .  topiramate (TOPAMAX) 50 MG tablet, TAKE 1 TABLET BY MOUTH 2 TIMES DAILY., Disp: 60 tablet, Rfl: 2 .  torsemide (DEMADEX) 20 MG tablet, Take 1 tablet (20 mg total) by mouth 2 (two) times daily., Disp: 180 tablet, Rfl: 0 .  albuterol (PROVENTIL HFA;VENTOLIN HFA) 108 (90 Base) MCG/ACT inhaler, Inhale 2 puffs into the lungs every 6 (six) hours as needed for wheezing., Disp: 1 Inhaler, Rfl: 4 .  AMBULATORY NON FORMULARY MEDICATION, 3 ml syringes 22 g 1 1/2 inch needles  For use with testosterone injection, Disp: 100 each, Rfl: 11 .  BAYER MICROLET LANCETS lancets, Use to check blood sugar 1 time per day., Disp: 100 each, Rfl: 12 .  benzonatate (TESSALON) 200 MG capsule, Take 1 capsule (200 mg total) by mouth 3 (three) times daily as needed for cough., Disp: 20 capsule, Rfl: 0 .  glucose blood (FREESTYLE LITE) test strip, 1 each by Other route daily. And lancets 1/day, Disp: 100 each, Rfl: 12 .  magnesium 30 MG tablet, Take 30 mg by mouth 2 (two) times daily., Disp: , Rfl:  .  montelukast (SINGULAIR) 10 MG tablet, TAKE 1 TABLET (10 MG TOTAL) BY MOUTH AT BEDTIME., Disp: 30 tablet, Rfl: 3 .  montelukast (SINGULAIR) 10 MG tablet, Take 1 tablet (10 mg total) by mouth at bedtime., Disp: 14 tablet, Rfl: 0  Allergies Other; Effexor xr [venlafaxine hcl er]; Lexapro [escitalopram oxalate]; Morphine and related; and Latex  Family History    Problem Relation Age of Onset  . Stroke Mother   . Arthritis Mother   . Hearing loss Mother   . Diabetes Father   . Early death Father 70    Diabetes complications  . Diabetes Sister   . Thyroid disease Neg Hx     Social History Social History  Substance Use Topics  . Smoking status: Light Tobacco Smoker    Types: Cigars  . Smokeless tobacco: Former Neurosurgeon    Types: Chew    Quit date: 05/17/2001  . Alcohol use No    Review of Systems Constitutional: As above. Eyes: No visual changes. ENT: As above. Cardiovascular: Denies chest pain. Respiratory: Denies shortness of breath. Gastrointestinal: No abdominal pain.   Genitourinary: Negative for dysuria. Musculoskeletal: Positive for chronic back pain in which she takes chronic oxycodone. Skin: Negative for rash.  ____________________________________________   PHYSICAL EXAM:  VITAL SIGNS: ED Triage Vitals  Enc Vitals Group  BP 08/17/16 1421 (P) 128/84     Pulse Rate 08/17/16 1421 (P) 72     Resp -- 18     Temp 08/17/16 1421 (P) 97.8 F (36.6 C)     Temp Source 08/17/16 1421 (P) Oral     SpO2 08/17/16 1421 (P) 98 %     Weight 08/17/16 1423 255 lb (115.7 kg)     Height 08/17/16 1423  (1.803 m)     Head Circumference --      Peak Flow --      Pain Score 08/17/16 1423 8     Pain Loc --      Pain Edu? --      Excl. in GC? --    Constitutional: Alert and oriented. Well appearing and in no acute distress. Eyes: Conjunctivae are normal. PERRL. EOMI. Head: Atraumatic. No sinus tenderness to palpation. No swelling. No erythema.  Ears: no erythema, normal TMs bilaterally.   Nose:Nasal congestion with clear rhinorrhea  Mouth/Throat: Mucous membranes are moist. Mild pharyngeal erythema. No tonsillar swelling or exudate.  Neck: No stridor.  No cervical spine tenderness to palpation. Hematological/Lymphatic/Immunilogical: No cervical lymphadenopathy. Cardiovascular: Normal rate, regular rhythm. Grossly normal heart  sounds.  Good peripheral circulation. Respiratory: Normal respiratory effort.  No retractions. No wheezes, rales or rhonchi. Good air movement.  Gastrointestinal: Soft and nontender.  Musculoskeletal: Ambulatory with steady gait. No cervical, thoracic or lumbar tenderness to palpation. Neurologic:  Normal speech and language. No gait instability. Skin:  Skin appears warm, dry and intact. No rash noted. Psychiatric: Mood and affect are normal. Speech and behavior are normal.  ___________________________________________   LABS (all labs ordered are listed, but only abnormal results are displayed)  Labs Reviewed  RAPID STREP SCREEN (NOT AT [facility])  CULTURE, GROUP A STREP [facility])     PROCEDURES Procedures    INITIAL IMPRESSION / ASSESSMENT AND PLAN / ED COURSE  Pertinent labs & imaging results that were available during my care of the patient were reviewed by me and considered in my medical decision making (see chart for details).  Well-appearing patient. No acute distress. Quick strep negative, will culture. Discussed with patient suspect allergic rhinitis versus viral upper respiratory infection. Encouraged supportive care. Refill for Singulair 2 weeks and when necessary Tessalon Perles given. Take over-the-counter Sudafed as needed. Encouraged nasal saline rinses. Encouraged patient to follow-up with his primary care physician.Discussed indication, risks and benefits of medications with patient.  Discussed follow up with Primary care physician this week. Discussed follow up and return parameters including no resolution or any worsening concerns. Patient verbalized understanding and agreed to plan.   ____________________________________________   FINAL CLINICAL IMPRESSION(S) / ED DIAGNOSES  Final diagnoses:  Acute allergic rhinitis, unspecified seasonality, unspecified trigger  Cough     Discharge Medication List as of 08/17/2016  3:13 PM    START taking these medications    Details  benzonatate (TESSALON) 200 MG capsule Take 1 capsule (200 mg total) by mouth 3 (three) times daily as needed for cough., Starting Tue 08/17/2016, Normal    !! montelukast (SINGULAIR) 10 MG tablet Take 1 tablet (10 mg total) by mouth at bedtime., Starting Tue 08/17/2016, Normal     !! - Potential duplicate medications found. Please discuss with provider.      Note: This dictation was prepared with Dragon dictation along with smaller phrase technology. Any transcriptional errors that result from this process are unintentional.         Renford Dills, NP  08/17/16 1629  

## 2016-08-20 LAB — CULTURE, GROUP A STREP (THRC)

## 2016-10-23 ENCOUNTER — Other Ambulatory Visit: Payer: Self-pay | Admitting: Endocrinology

## 2016-10-27 ENCOUNTER — Ambulatory Visit (INDEPENDENT_AMBULATORY_CARE_PROVIDER_SITE_OTHER): Admitting: Family Medicine

## 2016-10-27 ENCOUNTER — Encounter: Payer: Self-pay | Admitting: Family Medicine

## 2016-10-27 VITALS — BP 129/84 | HR 85 | Temp 97.7°F | Resp 20 | Wt 261.5 lb

## 2016-10-27 DIAGNOSIS — E291 Testicular hypofunction: Secondary | ICD-10-CM

## 2016-10-27 DIAGNOSIS — J302 Other seasonal allergic rhinitis: Secondary | ICD-10-CM

## 2016-10-27 DIAGNOSIS — K219 Gastro-esophageal reflux disease without esophagitis: Secondary | ICD-10-CM | POA: Diagnosis not present

## 2016-10-27 DIAGNOSIS — E669 Obesity, unspecified: Secondary | ICD-10-CM

## 2016-10-27 DIAGNOSIS — M5416 Radiculopathy, lumbar region: Secondary | ICD-10-CM | POA: Diagnosis not present

## 2016-10-27 DIAGNOSIS — E1169 Type 2 diabetes mellitus with other specified complication: Secondary | ICD-10-CM

## 2016-10-27 DIAGNOSIS — Z79899 Other long term (current) drug therapy: Secondary | ICD-10-CM

## 2016-10-27 DIAGNOSIS — E039 Hypothyroidism, unspecified: Secondary | ICD-10-CM | POA: Diagnosis not present

## 2016-10-27 MED ORDER — MONTELUKAST SODIUM 10 MG PO TABS: 10.0000 mg | ORAL_TABLET | Freq: Every day | ORAL | 1 refills | Status: AC

## 2016-10-27 MED ORDER — OMEPRAZOLE 40 MG PO CPDR: 40.0000 mg | DELAYED_RELEASE_CAPSULE | Freq: Every day | ORAL | 3 refills | Status: DC

## 2016-10-27 MED ORDER — PREGABALIN 75 MG PO CAPS: 75.0000 mg | ORAL_CAPSULE | Freq: Two times a day (BID) | ORAL | 1 refills | Status: DC

## 2016-10-27 NOTE — Patient Instructions (Signed)
1. We will refer you to New endocrine for testosterone supplement evaluation and diabetes.  2. If you want medication for headaches, I will refer you to neurology for full evaluation.  3. I have refilled the Protonix, singulair, Lyrica and will refill the thyroid med after we get your levels back.  4. We will call you with result from labs once we get them.  5. Make appt for physical next week with fasting labs before we can refill cholesterol medicines.  6. I would not refill valium without anxiety workup. The Demadex  is from your heart doctor, not here.   Please help [place] help you:  We are honored you have chosen [facility] for your Primary Care home. Below you will find basic instructions that you may need to access in the future. Please help [place] help you by reading the instructions, which cover many of the frequent questions we experience.   Prescription refills and request:  -In order to allow more efficient response time, please call your pharmacy for all refills. They will forward the request electronically to [place]. This allows for the quickest possible response. Request left on a nurse line can take longer to refill, since these are checked as time allows between office patients and other phone calls.  - refill request can take up to 3-5 working days to complete.  - If request is sent electronically and request is appropiate, it is usually completed in 1-2 business days.  - all patients will need to be seen routinely for all chronic medical conditions requiring prescription medications (see follow-up below). If you are overdue for follow up on your condition, you will be asked to make an appointment and we will call in enough medication to cover you until your appointment (up to 30 days).  - all controlled substances will require a face to face visit to request/refill.  - if you desire your prescriptions to go through a new pharmacy, and have an active script at original pharmacy, you will  need to call your pharmacy and have scripts transferred to new pharmacy. This is completed between the pharmacy locations and not by your provider.    Results: If any images or labs were ordered, it can take up to 1 week to get results depending on the test ordered and the lab/facility running and resulting the test. - Normal or stable results, which do not need further discussion, may be released to your mychart immediately with attached note to you. A call may not be generated for normal results. Please make certain to sign up for mychart. If you have questions on how to activate your mychart you can call the front office.  - If your results need further discussion, our office will attempt to contact you via phone, and if unable to reach you after 2 attempts, we will release your abnormal result to your mychart with instructions.  - All results will be automatically released in mychart after 1 week.  - Your provider will provide you with explanation and instruction on all relevant material in your results. Please keep in mind, results and labs may appear confusing or abnormal to the untrained eye, but it does not mean they are actually abnormal for you personally. If you have any questions about your results that are not covered, or you desire more detailed explanation than what was provided, you should make an appointment with your provider to do so.   Our office handles many outgoing and incoming calls daily.  If we have not contacted you within 1 week about your results, please check your mychart to see if there is a message first and if not, then contact our office.  In helping with this matter, you help decrease call volume, and therefore allow us to be able to respond to patients needs more efficiently.   Acute office visits (sick visit):  An acute visit is intended for a new problem and are scheduled in shorter time slots to allow schedule openings for patients with new problems. This is the  appropriate visit to discuss a new problem. In order to provide you with excellent quality medical care with proper time for you to explain your problem, have an exam and receive treatment with instructions, these appointments should be limited to one new problem per visit. If you experience a new problem, in which you desire to be addressed, please make an acute office visit, we save openings on the schedule to accommodate you. Please do not save your new problem for any other type of visit, let us take care of it properly and quickly for you.   Follow up visits:  Depending on your condition(s) your provider will need to see you routinely in order to provide you with quality care and prescribe medication(s). Most chronic conditions (Example: hypertension, Diabetes, depression/anxiety... etc), require visits a couple times a year. Your provider will instruct you on proper follow up for your personal medical conditions and history. Please make certain to make follow up appointments for your condition as instructed. Failing to do so could result in lapse in your medication treatment/refills. If you request a refill, and are overdue to be seen on a condition, we will always provide you with a 30 day script (once) to allow you time to schedule.    Medicare wellness (well visit): - we have a wonderful Nurse Selena Batten(Kim), that will meet with you and provide you will yearly medicare wellness visits. These visits should occur yearly (can not be scheduled less than 1 calendar year apart) and cover preventive health, immunizations, advance directives and screenings you are entitled to yearly through your medicare benefits. Do not miss out on your entitled benefits, this is when medicare will pay for these benefits to be ordered for you.  These are strongly encouraged by your provider and is the appropriate type of visit to make certain you are up to date with all preventive health benefits. If you have not had your medicare  wellness exam in the last 12 months, please make certain to schedule one by calling the office and schedule your medicare wellness with Selena BattenKim as soon as possible.   Yearly physical (well visit):  - Adults are recommended to be seen yearly for physicals. Check with your insurance and date of your last physical, most insurances require one calendar year between physicals. Physicals include all preventive health topics, screenings, medical exam and labs that are appropriate for gender/age and history. You may have fasting labs needed at this visit. This is a well visit (not a sick visit), new problems should not be covered during this visit (see acute visit).  - Pediatric patients are seen more frequently when they are younger. Your provider will advise you on well child visit timing that is appropriate for your their age. - This is not a medicare wellness visit. Medicare wellness exams do not have an exam portion to the visit. Some medicare companies allow for a physical, some do not allow a yearly physical. If your  medicare allows a yearly physical you can schedule the medicare wellness with our nurse Selena Batten and have your physical with your provider after, on the same day. Please check with insurance for your full benefits.   After physical next week, you will need to been seen routinely to have medications refilled. We can not cover all problems at every visit.   Late Policy/No Shows:  - all new patients should arrive 15-30 minutes earlier than appointment to allow [place] time  to  obtain all personal demographics,  insurance information and for you to complete office paperwork. - All established patients should arrive 10-15 minutes earlier than appointment time to update all information and be checked in .  - In our best efforts to run on time, if you are late for your appointment you will be asked to either reschedule or if able, we will work you back into the schedule. There will be a wait time to work you  back in the schedule,  depending on availability.  - If you are unable to make it to your appointment as scheduled, please call 24 hours ahead of time to allow [place] to fill the time slot with someone else who needs to be seen. If you do not cancel your appointment ahead of time, you may be charged a no show fee.

## 2016-10-27 NOTE — Progress Notes (Signed)
Jeremiah Long , [birth year], 41 y.o., male MRN: [id number] Patient Care Team    Relationship Specialty Notifications Start End  Ma Hillock, DO PCP - General Family Medicine  08/25/15   Renato Shin, MD Consulting Physician Endocrinology  11/21/15   Sanda Klein, MD Consulting Physician Cardiology  11/21/15   Hennie Duos, MD Consulting Physician Rheumatology  11/21/15   Suella Broad, MD Consulting Physician Physical Medicine and Rehabilitation  10/27/16     Chief Complaint  Patient presents with  . Hypothyroidism  . Hyperlipidemia     Subjective:   Lumbar radiculopathy Pt reports compliance with Lyrica. He denies side effects. He also is prescribed narcotic through chronic pain doctor and had received injections for pain. He currently is seeing [facility], Dr. Nelva Bush.   Gastroesophageal reflux disease without esophagitis He states he has tried to decrease dose and has been unable to do so without return of symptoms. He has a h/o low vit d and supplements.   Hypothyroidism, unspecified type TSH over 1 year ago, normal. Pt reports feeling "off.' He states he feels like his medications are not working well. Of note NCCS database shows use of phentermine, which may be causing the symptom he is reporting. He feels more anxious.  He reports compliance with synthroid 137 mcg daily.   Seasonal allergic rhinitis, unspecified trigger Pt states he doe snot know if he is feeling more anxious, short of breath or allergies. He ran of singulair so he is wondering if the symptoms are form his allergies.   Hypogonadism male/Diabetes mellitus type 2 in obese (HCC) Pt hs not followed with endocrine for his diabetes. He had issues with the office billing and would like a referral to new endocrinologist that would also be willing to evaluate/manage his testosterone supplements. He use to take testosterone and would like to restart.    Depression screen [facility] 2/9 01/02/2016 10/03/2015    Decreased Interest 0 0  Down, Depressed, Hopeless 1 1  PHQ - 2 Score 1 1    Allergies  Allergen Reactions  . Other Other (See Comments)    fatigue  . Effexor Xr [Venlafaxine Hcl Er]     fatigue  . Lexapro [Escitalopram Oxalate]     Panic attacks  . Morphine And Related Other (See Comments)    Migraines  . Latex Itching   Social History  Substance Use Topics  . Smoking status: Light Tobacco Smoker    Types: Cigars  . Smokeless tobacco: Former Systems developer    Types: Chew    Quit date: 05/17/2001  . Alcohol use No   Past Medical History:  Diagnosis Date  . Allergy   . Anemia   . Arthritis   . Asthma   . Chronic gout 11/02/2011  . Depression 12/16/2011  . Diabetes mellitus type 2 in obese ([place]) 11/02/2011  . Edema 12/16/2011  . Hx of adenomatous colonic polyps 11/02/2011  . Hyperlipidemia LDL goal <160 11/02/2011  . Hypogonadism male 11/02/2011  . Hypothyroidism 11/02/2011  . Obesity, Class II, BMI 35-39.9, with comorbidity 12/16/2011  . Right C6 radiculopathy 12/16/2011   Past Surgical History:  Procedure Laterality Date  . SHOULDER SURGERY Left   . [place]  2009   "decompression"  . TONSILLECTOMY     Family History  Problem Relation Age of Onset  . Stroke Mother   . Arthritis Mother   . Hearing loss Mother   . Diabetes Father   . Early death Father 61  Diabetes complications  . Diabetes Sister   . Thyroid disease Neg Hx    Allergies as of 10/27/2016      Reactions   Other Other (See Comments)   fatigue   Effexor Xr [venlafaxine Hcl Er]    fatigue   Lexapro [escitalopram Oxalate]    Panic attacks   Morphine And Related Other (See Comments)   Migraines   Latex Itching      Medication List       Accurate as of 10/27/16  5:27 PM. Always use your most recent med list.          albuterol 108 (90 Base) MCG/ACT inhaler Commonly known as:  PROVENTIL HFA;VENTOLIN HFA Inhale 2 puffs into the lungs every 6 (six) hours as needed for wheezing.   atorvastatin  80 MG tablet Commonly known as:  LIPITOR TAKE 1 TABLET (80 MG TOTAL) BY MOUTH DAILY.   BAYER MICROLET LANCETS lancets Use to check blood sugar 1 time per day.   diazepam 5 MG tablet Commonly known as:  VALIUM One by mouth every night to help with pain/sleep, may take one additional dose in the daytime as needed for neck pain.   fenofibrate 145 MG tablet Commonly known as:  TRICOR Take 1 tablet (145 mg total) by mouth daily.   ferrous sulfate 325 (65 FE) MG EC tablet Take 325 mg by mouth daily.   glucose blood test strip Commonly known as:  FREESTYLE LITE 1 each by Other route daily. And lancets 1/day   JANUVIA 100 MG tablet Generic drug:  sitaGLIPtin TAKE 1 TABLET DAILY   KLOR-CON 10 10 MEQ tablet Generic drug:  potassium chloride TAKE 2 TABLETS DAILY   metFORMIN 500 MG 24 hr tablet Commonly known as:  GLUCOPHAGE-XR TAKE 2 TABLETS BY MOUTH WITH BREAKFAST AND 1 TABLET WITH DINNER   montelukast 10 MG tablet Commonly known as:  SINGULAIR Take 1 tablet (10 mg total) by mouth at bedtime.   omeprazole 40 MG capsule Commonly known as:  PRILOSEC Take 1 capsule (40 mg total) by mouth daily.   oxyCODONE-acetaminophen 7.5-325 MG tablet Commonly known as:  PERCOCET Take 1 tablet by mouth 2 (two) times daily as needed (neck pain).   pregabalin 75 MG capsule Commonly known as:  LYRICA Take 1 capsule (75 mg total) by mouth 2 (two) times daily.   SYNTHROID 137 MCG tablet Generic drug:  levothyroxine TAKE 1 TABLET DAILY   torsemide 20 MG tablet Commonly known as:  DEMADEX Take 1 tablet (20 mg total) by mouth 2 (two) times daily.   Vitamin D-3 1000 units Caps Take by mouth.       All past medical history, surgical history, allergies, family history, immunizations andmedications were updated in the EMR today and reviewed under the history and medication portions of their EMR.     ROS: Negative, with the exception of above mentioned in HPI   Objective:  BP 129/84 (BP  Location: Right Arm, Patient Position: Sitting, Cuff Size: Large)   Pulse 85   Temp 97.7 F (36.5 C)   Resp 20   Wt 261 lb 8 oz (118.6 kg)   SpO2 97%   BMI 36.47 kg/m  Body mass index is 36.47 kg/m. Gen: Afebrile. No acute distress. Nontoxic in appearance, well developed, well nourished.  HENT: AT. [place]. MMM, no oral lesions.  Eyes:Pupils Equal Round Reactive to light, Extraocular movements intact,  Conjunctiva without redness, discharge or icterus. CV: RRR , no edema Chest: CTAB, no  wheeze or crackles.  Abd: Soft.  NTND. BS present  Skin: No rashes, purpura or petechiae.  Neuro: Normal gait. PERLA. EOMi. Alert. Oriented x3  Psych: Normal affect, dress and demeanor. Normal speech. Normal thought content and judgment.  No exam data present No results found. No results found for this or any previous visit (from the past 24 hour(s)).  Assessment/Plan: Jeremiah Long is a 41 y.o. male present for OV for  Lumbar radiculopathy - willing to refill, but could get through his chronic pain doctor.  - pregabalin (LYRICA) 75 MG capsule; Take 1 capsule (75 mg total) by mouth 2 (two) times daily.  Dispense: 180 capsule; Refill: 1 - If continues to obtain refills here will need every 6 months follow up.   Gastroesophageal reflux disease without esophagitis - refills provided today. Pt reports unable to wean or lower dose  - Would need b12, vit d and mag checked every 3 years.  - omeprazole (PRILOSEC) 40 MG capsule; Take 1 capsule (40 mg total) by mouth daily.  Dispense: 90 capsule; Refill: 3 - follow yearly  Hypothyroidism, unspecified type - currently prescribed synthroid 137 mcg, reports compliance daily on empty stomach.  - TSH - refills will be provided after results of TSH obtained.   Encounter for long-term (current) use of medications - Comp Met (CMET)  Seasonal allergic rhinitis, unspecified trigger - refills on singular provided  - f/u 1 year  Hypogonadism male - pt  would like new endocrine to manage testosterone and evaluate for need. {rior h/o supplementation. Declined to manage here secondary to edema and cholesterol with use.  - Ambulatory referral to Endocrinology  Diabetes mellitus type 2 in obese ([place]) - pt request referral to new endo, he has not had a DM appt in 10 months.  - Ambulatory referral to Endocrinology  * pt wants to discuss headaches and anxiety--> will need to schedule OV to address these issues separately. Of note, he is on phentermine per NCCS database--> would potentially increase anxiety/headaches. Prior h/o topamax, uncertain other medication trials or work up  * pt counseled on appt types. We can not address every issue today, he will need routine visit for chronic medical illnesses, acute visits for new problems and yearly CPE.   CPE labs next visit--> 1 week encouraged: cbc, lipids, a1c, vit d, mag, b12,  urine microalbumin  Reviewed expectations re: course of current medical issues.  Discussed self-management of symptoms.  Outlined signs and symptoms indicating need for more acute intervention.  Patient verbalized understanding and all questions were answered.  Patient received an After-Visit Summary.     Note is dictated utilizing voice recognition software. Although note has been proof read prior to signing, occasional typographical errors still can be missed. If any questions arise, please do not hesitate to call for verification.   electronically signed by:  Howard Pouch, DO  [place]

## 2016-10-28 LAB — COMPREHENSIVE METABOLIC PANEL
ALK PHOS: 70 U/L (ref 39–117)
ALT: 37 U/L (ref 0–53)
AST: 21 U/L (ref 0–37)
Albumin: 4.5 g/dL (ref 3.5–5.2)
BILIRUBIN TOTAL: 1 mg/dL (ref 0.2–1.2)
BUN: 15 mg/dL (ref 6–23)
CO2: 24 meq/L (ref 19–32)
Calcium: 9.6 mg/dL (ref 8.4–10.5)
Chloride: 106 mEq/L (ref 96–112)
Creatinine, Ser: 0.91 mg/dL (ref 0.40–1.50)
GFR: 96.75 mL/min (ref >60.00)
GLUCOSE: 100 mg/dL — AB (ref 70–99)
Potassium: 4 mEq/L (ref 3.5–5.1)
SODIUM: 139 meq/L (ref 135–145)
TOTAL PROTEIN: 7.2 g/dL (ref 6.0–8.3)

## 2016-10-28 LAB — TSH: TSH: 3.37 u[IU]/mL (ref 0.35–4.50)

## 2016-10-29 ENCOUNTER — Telehealth: Payer: Self-pay | Admitting: Family Medicine

## 2016-10-29 DIAGNOSIS — E039 Hypothyroidism, unspecified: Secondary | ICD-10-CM

## 2016-10-29 MED ORDER — LEVOTHYROXINE SODIUM 137 MCG PO TABS: 137.0000 ug | ORAL_TABLET | Freq: Every day | ORAL | 3 refills | Status: DC

## 2016-10-29 NOTE — Telephone Encounter (Signed)
Please call pt: - his kidney function and thyroid function are normal, refills on thyroid med called in for him.

## 2016-10-29 NOTE — Telephone Encounter (Signed)
Detailed message left on voice mail. Okay per DPR. 

## 2016-10-31 ENCOUNTER — Other Ambulatory Visit: Payer: Self-pay | Admitting: Cardiovascular Disease

## 2016-11-02 ENCOUNTER — Ambulatory Visit (INDEPENDENT_AMBULATORY_CARE_PROVIDER_SITE_OTHER): Admitting: Family Medicine

## 2016-11-02 ENCOUNTER — Encounter: Payer: Self-pay | Admitting: Family Medicine

## 2016-11-02 VITALS — BP 124/82 | HR 81 | Temp 98.3°F | Resp 20 | Ht 71.0 in | Wt 259.2 lb

## 2016-11-02 DIAGNOSIS — E781 Pure hyperglyceridemia: Secondary | ICD-10-CM

## 2016-11-02 DIAGNOSIS — E119 Type 2 diabetes mellitus without complications: Secondary | ICD-10-CM

## 2016-11-02 DIAGNOSIS — Z0001 Encounter for general adult medical examination with abnormal findings: Secondary | ICD-10-CM | POA: Diagnosis not present

## 2016-11-02 DIAGNOSIS — Z23 Encounter for immunization: Secondary | ICD-10-CM | POA: Diagnosis not present

## 2016-11-02 DIAGNOSIS — E1169 Type 2 diabetes mellitus with other specified complication: Secondary | ICD-10-CM

## 2016-11-02 DIAGNOSIS — Z79899 Other long term (current) drug therapy: Secondary | ICD-10-CM

## 2016-11-02 DIAGNOSIS — Z125 Encounter for screening for malignant neoplasm of prostate: Secondary | ICD-10-CM

## 2016-11-02 DIAGNOSIS — Z130 Encounter for screening for diseases of the blood and blood-forming organs and certain disorders involving the immune mechanism: Secondary | ICD-10-CM

## 2016-11-02 DIAGNOSIS — K219 Gastro-esophageal reflux disease without esophagitis: Secondary | ICD-10-CM | POA: Diagnosis not present

## 2016-11-02 DIAGNOSIS — Z6836 Body mass index (BMI) 36.0-36.9, adult: Secondary | ICD-10-CM

## 2016-11-02 DIAGNOSIS — E785 Hyperlipidemia, unspecified: Secondary | ICD-10-CM | POA: Diagnosis not present

## 2016-11-02 DIAGNOSIS — Z86010 Personal history of colonic polyps: Secondary | ICD-10-CM

## 2016-11-02 DIAGNOSIS — E669 Obesity, unspecified: Secondary | ICD-10-CM | POA: Diagnosis not present

## 2016-11-02 DIAGNOSIS — Z860101 Personal history of adenomatous and serrated colon polyps: Secondary | ICD-10-CM

## 2016-11-02 DIAGNOSIS — E559 Vitamin D deficiency, unspecified: Secondary | ICD-10-CM

## 2016-11-02 LAB — CBC WITH DIFFERENTIAL/PLATELET
BASOS ABS: 0.1 10*3/uL (ref 0.0–0.1)
Basophils Relative: 0.8 % (ref 0.0–3.0)
EOS ABS: 0.1 10*3/uL (ref 0.0–0.7)
Eosinophils Relative: 1.7 % (ref 0.0–5.0)
HEMATOCRIT: 45.3 % (ref 39.0–52.0)
HEMOGLOBIN: 15.2 g/dL (ref 13.0–17.0)
LYMPHS PCT: 26.9 % (ref 12.0–46.0)
Lymphs Abs: 2 10*3/uL (ref 0.7–4.0)
MCHC: 33.6 g/dL (ref 30.0–36.0)
MCV: 87 fl (ref 78.0–100.0)
Monocytes Absolute: 0.4 10*3/uL (ref 0.1–1.0)
Monocytes Relative: 5.5 % (ref 3.0–12.0)
Neutro Abs: 4.9 10*3/uL (ref 1.4–7.7)
Neutrophils Relative %: 65.1 % (ref 43.0–77.0)
Platelets: 242 10*3/uL (ref 150.0–400.0)
RBC: 5.2 Mil/uL (ref 4.22–5.81)
RDW: 14.1 % (ref 11.5–15.5)
WBC: 7.5 10*3/uL (ref 4.0–10.5)

## 2016-11-02 LAB — LIPID PANEL
CHOL/HDL RATIO: 3
CHOLESTEROL: 138 mg/dL (ref 0–200)
HDL: 41.2 mg/dL (ref >39.00)
NonHDL: 97.28
TRIGLYCERIDES: 234 mg/dL — AB (ref 0.0–149.0)
VLDL: 46.8 mg/dL — AB (ref 0.0–40.0)

## 2016-11-02 LAB — VITAMIN D 25 HYDROXY (VIT D DEFICIENCY, FRACTURES): VITD: 34.75 ng/mL (ref 30.00–100.00)

## 2016-11-02 LAB — VITAMIN B12: VITAMIN B 12: 503 pg/mL (ref 211–911)

## 2016-11-02 LAB — LDL CHOLESTEROL, DIRECT: LDL DIRECT: 63 mg/dL

## 2016-11-02 LAB — MAGNESIUM: Magnesium: 1.9 mg/dL (ref 1.5–2.5)

## 2016-11-02 LAB — PSA: PSA: 0.83 ng/mL (ref 0.10–4.00)

## 2016-11-02 LAB — HEMOGLOBIN A1C: Hgb A1c MFr Bld: 6.4 % (ref 4.6–6.5)

## 2016-11-02 NOTE — Patient Instructions (Signed)

## 2016-11-02 NOTE — Progress Notes (Signed)
   Patient ID: Jeremiah Long, male  DOB: 11/19/1975, 40 y.o.   MRN: [id number] Patient Care Team    Relationship Specialty Notifications Start End  Kuneff, Renee A, DO PCP - General Family Medicine  08/25/15   Ellison, Sean, MD Consulting Physician Endocrinology  11/21/15   Croitoru, Mihai, MD Consulting Physician Cardiology  11/21/15   Beekman, James F, MD Consulting Physician Rheumatology  11/21/15   Ramos, Richard, MD Consulting Physician Physical Medicine and Rehabilitation  10/27/16     Chief Complaint  Patient presents with  . Annual Exam    Subjective:  Jeremiah Long is a 40 y.o.  Male  present for CPE. All past medical history, surgical history, allergies, family history, immunizations, medications and social history were updated in the electronic medical record today. All recent labs, ED visits and hospitalizations within the last year were reviewed.  Health maintenance:  Colonoscopy: completed 12/15/2011, by Dr. Bray, resutls 3 polyps. follow up unknown. Immunizations: tdap 2013, Influenza  (encouraged yearly), PNA --> both due. Infectious disease screening: HIV completed PSA: 12/05/2013 0.41. Assistive device: None Oxygen use: none Patient has a Dental home with routine visits.  Hospitalizations/ED visits: none  Depression screen PHQ 2/9 11/02/2016 01/02/2016 10/03/2015  Decreased Interest 0 0 0  Down, Depressed, Hopeless 1 1 1  PHQ - 2 Score 1 1 1   No flowsheet data found.   Current Exercise Habits: Structured exercise class, Type of exercise: strength training/weights, Time (Minutes): 30, Frequency (Times/Week): 5, Weekly Exercise (Minutes/Week): 150, Intensity: Moderate     Immunization History  Administered Date(s) Administered  . Hepatitis B 03/23/2012  . Influenza Split 02/17/2012  . Influenza,inj,Quad PF,36+ Mos 04/23/2013, 03/04/2014  . Pneumococcal Conjugate-13 11/02/2016     Past Medical History:  Diagnosis Date  . Allergy   . Anemia    . Arthritis   . Asthma   . Chronic gout 11/02/2011  . Depression 12/16/2011  . Diabetes mellitus type 2 in obese (HCC) 11/02/2011  . Edema 12/16/2011  . Hx of adenomatous colonic polyps 11/02/2011  . Hyperlipidemia LDL goal <160 11/02/2011  . Hypogonadism male 11/02/2011  . Hypothyroidism 11/02/2011  . Obesity, Class II, BMI 35-39.9, with comorbidity 12/16/2011  . Right C6 radiculopathy 12/16/2011   Allergies  Allergen Reactions  . Other Other (See Comments)    fatigue  . Effexor Xr [Venlafaxine Hcl Er]     fatigue  . Lexapro [Escitalopram Oxalate]     Panic attacks  . Morphine And Related Other (See Comments)    Migraines  . Latex Itching   Past Surgical History:  Procedure Laterality Date  . SHOULDER SURGERY Left   . SPINE SURGERY  2009   "decompression"  . TONSILLECTOMY     Family History  Problem Relation Age of Onset  . Stroke Mother   . Arthritis Mother   . Hearing loss Mother   . Diabetes Father   . Early death Father 50       Diabetes complications  . Diabetes Sister   . Thyroid disease Neg Hx    Social History   Social History  . Marital status: Single    Spouse name: N/A  . Number of children: N/A  . Years of education: N/A   Occupational History  . Not on file.   Social History Main Topics  . Smoking status: Light Tobacco Smoker    Types: Cigars  . Smokeless tobacco: Former User    Types: Chew      Quit date: 05/17/2001  . Alcohol use No  . Drug use: No  . Sexual activity: Yes    Birth control/ protection: None   Other Topics Concern  . Not on file   Social History Narrative   Divorced.    Cigar smoker, No etoh or drugs   Executive protection/law enforcement--> Dec 2016 received  (BS) degree in Librarian, academic.   He is originally from [place] and has served in the armed forces. 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    He exercises at the gym one or 2 days a week for no more than an hour or 2  at a time, limited by pain and swelling in his joints.   Drinks caffeine, uses herbal remedies, takes a daily vitamin.   Wears his seatbelt, smoke detector in the morning, firearms in the home.   Feels safe in his relationships.   Patient has received psychiatric counseling and last 5 years.      Allergies as of 11/02/2016      Reactions   Other Other (See Comments)   fatigue   Effexor Xr [venlafaxine Hcl Er]    fatigue   Lexapro [escitalopram Oxalate]    Panic attacks   Morphine And Related Other (See Comments)   Migraines   Latex Itching      Medication List       Accurate as of 11/02/16  1:57 PM. Always use your most recent med list.          albuterol 108 (90 Base) MCG/ACT inhaler Commonly known as:  PROVENTIL HFA;VENTOLIN HFA Inhale 2 puffs into the lungs every 6 (six) hours as needed for wheezing.   atorvastatin 80 MG tablet Commonly known as:  LIPITOR TAKE 1 TABLET (80 MG TOTAL) BY MOUTH DAILY.   BAYER MICROLET LANCETS lancets Use to check blood sugar 1 time per day.   diazepam 5 MG tablet Commonly known as:  VALIUM One by mouth every night to help with pain/sleep, may take one additional dose in the daytime as needed for neck pain.   fenofibrate 145 MG tablet Commonly known as:  TRICOR Take 1 tablet (145 mg total) by mouth daily.   ferrous sulfate 325 (65 FE) MG EC tablet Take 325 mg by mouth daily.   glucose blood test strip Commonly known as:  FREESTYLE LITE 1 each by Other route daily. And lancets 1/day   JANUVIA 100 MG tablet Generic drug:  sitaGLIPtin TAKE 1 TABLET DAILY   KLOR-CON 10 10 MEQ tablet Generic drug:  potassium chloride TAKE 2 TABLETS DAILY   levothyroxine 137 MCG tablet Commonly known as:  SYNTHROID Take 1 tablet (137 mcg total) by mouth daily.   metFORMIN 500 MG 24 hr tablet Commonly known as:  GLUCOPHAGE-XR TAKE 2 TABLETS BY MOUTH WITH BREAKFAST AND 1 TABLET WITH DINNER   montelukast 10 MG tablet Commonly known as:   SINGULAIR Take 1 tablet (10 mg total) by mouth at bedtime.   omeprazole 40 MG capsule Commonly known as:  PRILOSEC Take 1 capsule (40 mg total) by mouth daily.   oxyCODONE-acetaminophen 7.5-325 MG tablet Commonly known as:  PERCOCET Take 1 tablet by mouth 2 (two) times daily as needed (neck pain).   phentermine 37.5 MG capsule Take 37.5 mg by mouth every morning.   pregabalin 75 MG capsule Commonly known as:  LYRICA Take 1 capsule (75 mg total) by mouth 2 (two) times daily.   torsemide 20  MG tablet Commonly known as:  DEMADEX TAKE 1 TABLET TWICE A DAY (CALL [phone] TO SCHEDULE FOR ADDITIONAL REFILLS)   Vitamin D-3 1000 units Caps Take by mouth.       All past medical history, surgical history, allergies, family history, immunizations andmedications were updated in the EMR today and reviewed under the history and medication portions of their EMR.     Recent Results (from the past 2160 hour(s))  Rapid strep screen     Status: None   Collection Time: 08/17/16  2:19 PM  Result Value Ref Range   Streptococcus, Group A Screen (Direct) NEGATIVE NEGATIVE    Comment: (NOTE) A Rapid Antigen test may result negative if the antigen level in the sample is below the detection level of this test. The FDA has not cleared this test as a stand-alone test therefore the rapid antigen negative result has reflexed to a Group A Strep culture.   Culture, group A strep     Status: None   Collection Time: 08/17/16  2:19 PM  Result Value Ref Range   Specimen Description THROAT    Special Requests NONE Reflexed from [id number]    Culture      NO GROUP A STREP (S.PYOGENES) ISOLATED Performed at [facility], [address] [place], [place] [address]    Report Status 08/20/2016 FINAL   TSH     Status: None   Collection Time: 10/27/16  3:44 PM  Result Value Ref Range   TSH 3.37 0.35 - 4.50 uIU/mL  Comp Met (CMET)     Status: Abnormal   Collection Time: 10/27/16  3:44 PM  Result  Value Ref Range   Sodium 139 135 - 145 mEq/L   Potassium 4.0 3.5 - 5.1 mEq/L   Chloride 106 96 - 112 mEq/L   CO2 24 19 - 32 mEq/L   Glucose, Bld 100 (H) 70 - 99 mg/dL   BUN 15 6 - 23 mg/dL   Creatinine, Ser 0.91 0.40 - 1.50 mg/dL   Total Bilirubin 1.0 0.2 - 1.2 mg/dL   Alkaline Phosphatase 70 39 - 117 U/L   AST 21 0 - 37 U/L   ALT 37 0 - 53 U/L   Total Protein 7.2 6.0 - 8.3 g/dL   Albumin 4.5 3.5 - 5.2 g/dL   Calcium 9.6 8.4 - 10.5 mg/dL   GFR 96.75 >60.00 mL/min    No results found.   ROS: 14 pt review of systems performed and negative (unless mentioned in an HPI)  Objective: BP 124/82 (BP Location: Left Arm, Patient Position: Sitting, Cuff Size: Large)   Pulse 81   Temp 98.3 F (36.8 C)   Resp 20   Ht 5' 11" (1.803 m)   Wt 259 lb 4 oz (117.6 kg)   SpO2 97%   BMI 36.16 kg/m  Gen: Afebrile. No acute distress. Nontoxic in appearance, well-developed, well-nourished,  Obese, talkative.  HENT: AT. [place]. Bilateral TM visualized and normal in appearance, normal external auditory canal. MMM, no oral lesions, adequate dentition. Bilateral nares within normal limits. Throat without erythema, ulcerations or exudates. no Cough on exam, no hoarseness on exam. Eyes:Pupils Equal Round Reactive to light, Extraocular movements intact,  Conjunctiva without redness, discharge or icterus. Neck/lymp/endocrine: Supple,no lymphadenopathy, no thyromegaly CV: RRR no murmur, no edema, +2/4 P posterior tibialis pulses. no carotid bruits. No JVD. Chest: CTAB, no wheeze, rhonchi or crackles. normal Respiratory effort. good Air movement. Abd: Soft. obese. NTND. BS present. no Masses palpated.   No hepatosplenomegaly. No rebound tenderness or guarding. Skin: no rashes, purpura or petechiae. Warm and well-perfused. Skin intact. Neuro/Msk:  Normal gait. PERLA. EOMi. Alert. Oriented x3.  Cranial nerves II through XII intact. Muscle strength 5/5 upper/lower extremity. DTRs equal bilaterally. Psych: Normal  affect, dress and demeanor. Normal speech. Normal thought content and judgment.   No exam data present  Assessment/plan: Jeremiah Long is a 40 y.o. male present for CPE. Hypertriglyceridemia/hyperlipidemia - refills on tricor (if appropriate-has not been taking) and Lipitor after results received.  - Lipid panel  Diabetes mellitus type 2 in obese (HCC) - pt is currenlty waiting on new endocrine referral appt. Schedule in 3 weeks. Management and refills will be through that office. Prior endocrine was Dr. Ellison.  - HgB A1c - Microalbumin / creatinine urine ratio  Encounter for long-term (current) use of medications/vit d deficiency Gastroesophageal reflux disease without esophagitis - long term high dose PPI use. Monitor Vit d, b12 and mag every 3 years  - Currently taking D3 1000 u daily - Vitamin D (25 hydroxy) - Magnesium - B12  Prostate cancer screening - pt has been intermittently using testosterone injections. Currently waiting on endocrine referral to continue supplement.  - PSA  BMI 36.0-36.9,adult - Diet and exercise counseled. > 150 minutes a week recommended. Currently using phentermine provided by a weight loss clinic in [place]. Complaining of increased anxiety and headaches, encouraged him to discuss with weight loss clinic since it is likely side effects to phentermine use.  - HgB A1c - Lipid panel  Screening for deficiency anemia - CBC w/Diff  Encounter for general adult medical examination with abnormal findings Patient was encouraged to exercise greater than 150 minutes a week. Patient was encouraged to choose a diet filled with fresh fruits and vegetables, and lean meats. AVS provided to patient today for education/recommendation on gender specific health and safety maintenance. Colonoscopy: completed 12/15/2011, by Dr. Bray, resutls 3 polyps. follow up unknown.--> pt needs to call them.  Immunizations: tdap 2013, Influenza  (encouraged yearly),  Prevnar completed today (pneumovax 1 year) Infectious disease screening: HIV completed PSA: 12/05/2013 0.41. Collected today Need for vaccination with 13-polyvalent pneumococcal conjugate vaccine - Pneumococcal conjugate vaccine 13-valen Hx of adenomatous colonic polyps - pt has a h/o polyps 2013, he needs to call them for follow up. He is uncertain when he is to return and records received do not state.    Return in about 1 year (around 11/02/2017) for CPE.  Electronically signed by: Renee Kuneff, DO [facility]  

## 2016-11-03 ENCOUNTER — Telehealth: Payer: Self-pay | Admitting: Family Medicine

## 2016-11-03 LAB — MICROALBUMIN / CREATININE URINE RATIO
Creatinine, Urine: 17 mg/dL — ABNORMAL LOW (ref 20–370)
Microalb, Ur: <0.2 mg/dL

## 2016-11-03 MED ORDER — FENOFIBRATE 145 MG PO TABS: 145.0000 mg | ORAL_TABLET | Freq: Every day | ORAL | 3 refills | Status: DC

## 2016-11-03 NOTE — Telephone Encounter (Signed)
Please call pt, his labs all look stable with the  Exception of his triglycerides are elevated above goal. The tricor (fenofibrate) that he was not taking (accidently) helps lower triglycerides. I have refilled this for him today with his lipitor as well.  A1c looks good at 6.4. - his colonoscopy may be due this year. He was to ask Dr. Jason FilaBray (digestive health specialist) about the timeline. It was done in 2013 and had polyps. I do not have the report results to guide him on this, it is usually 3 or 5 years follow up so he is likely overdue. IPlease advise.

## 2016-11-03 NOTE — Telephone Encounter (Signed)
Spoke with patient reviewed lab results and instructions with patient. Patient verbalized understanding. [place] is in [place] right now and will check with Dr Jason FilaBray about his colonoscopy when he gets back.

## 2016-12-10 ENCOUNTER — Other Ambulatory Visit: Payer: Self-pay | Admitting: Endocrinology

## 2016-12-13 ENCOUNTER — Other Ambulatory Visit: Payer: Self-pay | Admitting: Endocrinology

## 2017-01-24 IMAGING — MR MR HIP*L* W/O CM
4 of 5 series · 12 of 40 positions shown · non-contrast
Comparison: Plain films left hip 11/21/2015.

CLINICAL DATA: Left hip, groin and buttock pain for 3 weeks. No
known injury. Initial encounter.

EXAM:
MR OF THE LEFT HIP WITHOUT CONTRAST
TECHNIQUE: Multiplanar, multisequence MR imaging was performed. No intravenous
contrast was administered.

[Series 3: T1 · coronal · 4.0mm · 0.47mm/px · 3 of 22 slices shown (1 of 2)]
[im 4/22]
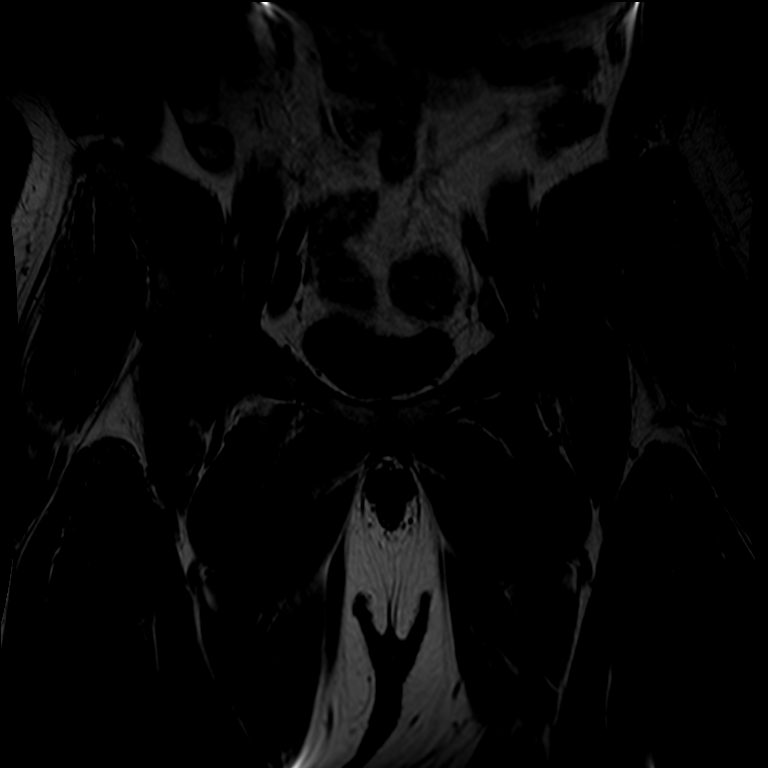
[im 13/22]
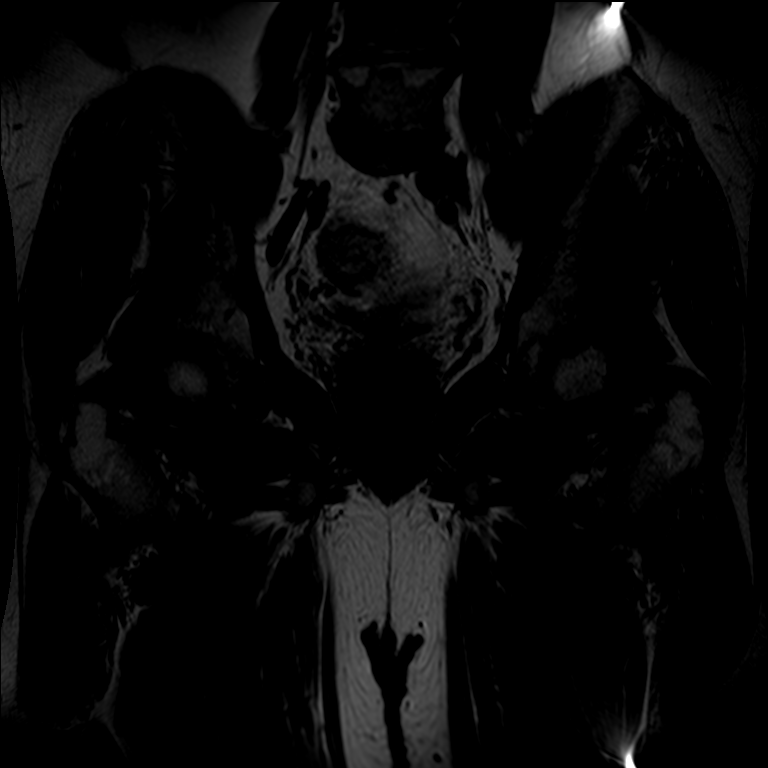
[im 19/22]
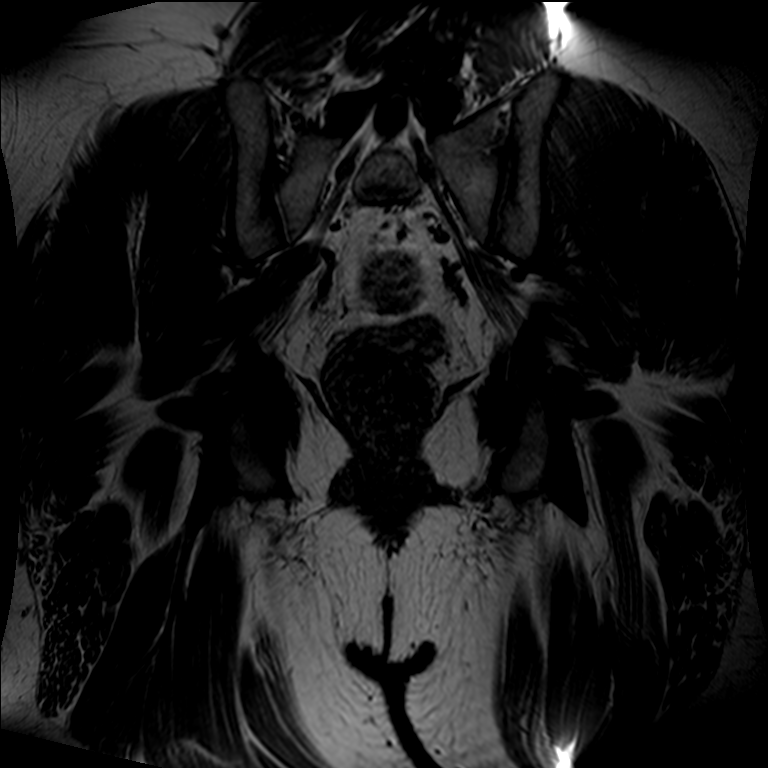

[Series 6: T2 fat-sat · axial · 4.0mm · 0.47mm/px · z∈[-65,+16]mm · 3 of 25 slices shown]
[im 4/25]
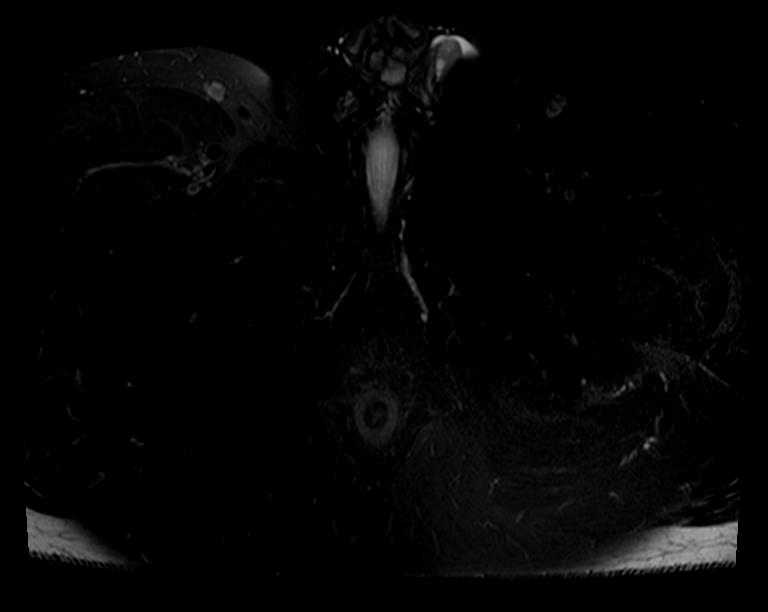
[im 14/25]
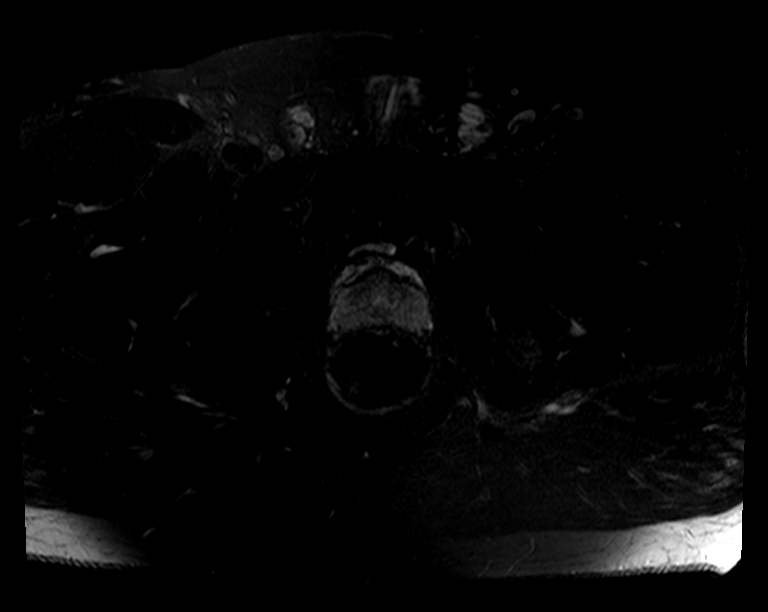
[im 21/25]
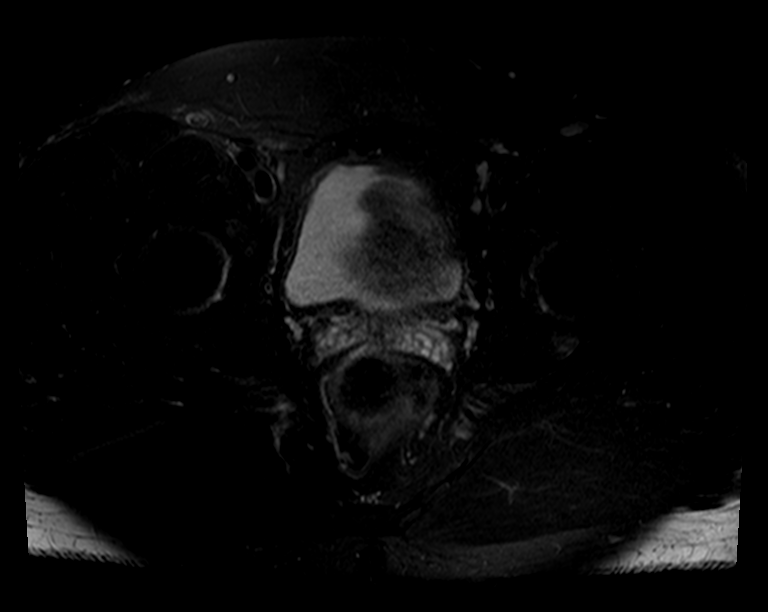

[Series 7: PD fat-sat · sagittal · 4.0mm · 0.27mm/px · 3 of 23 slices shown]
[im 4/23]
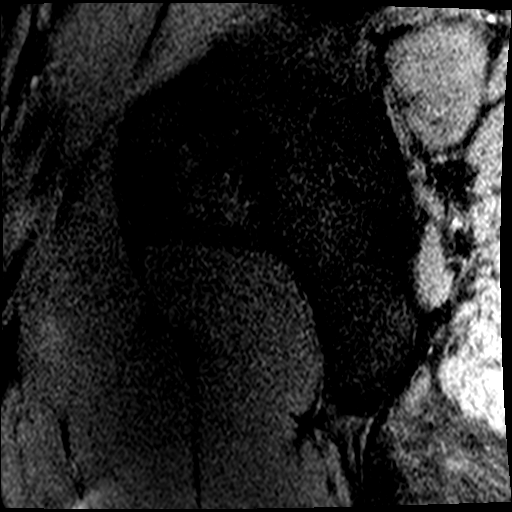
[im 13/23]
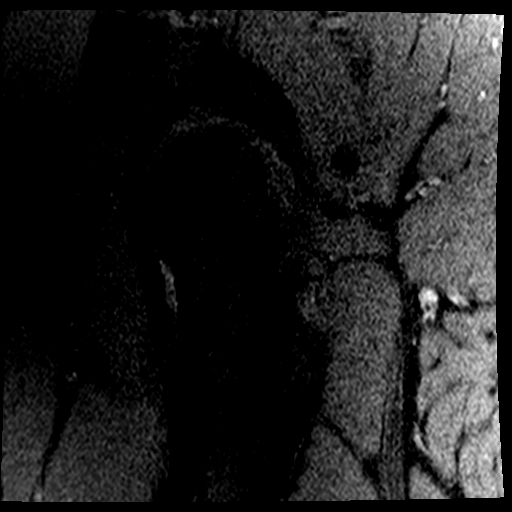
[im 19/23]
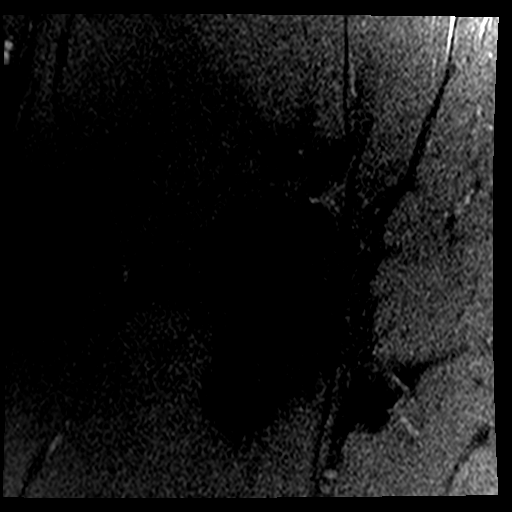

[Series 8: T1 · axial · 4.0mm · 0.47mm/px · z∈[-67,+18]mm · 3 of 25 slices shown (2 of 2)]
[im 4/25]
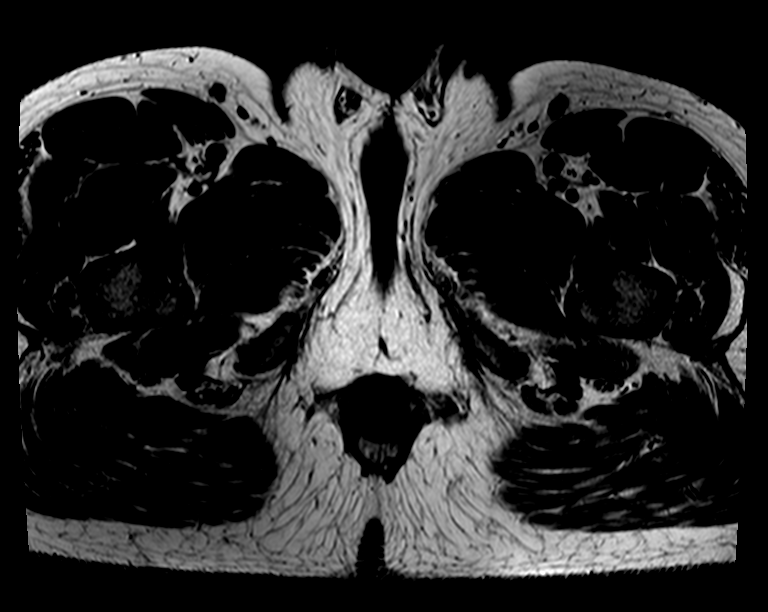
[im 14/25]
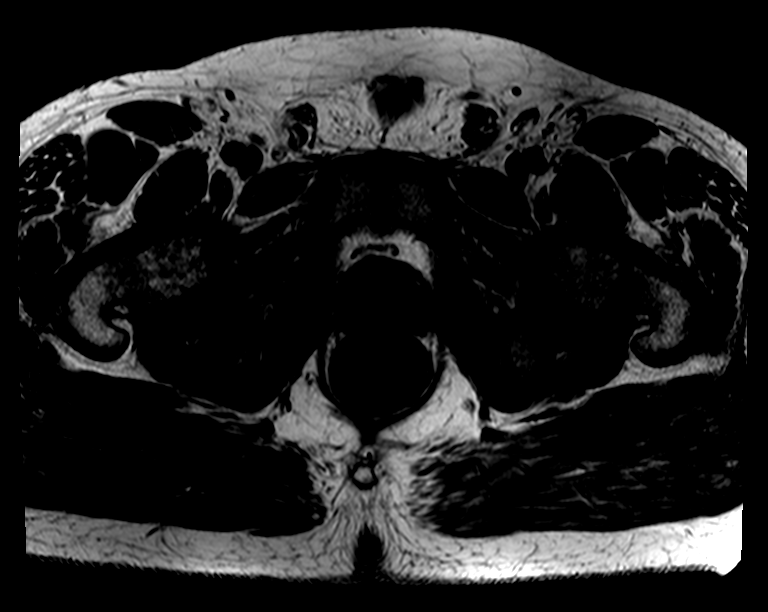
[im 21/25]
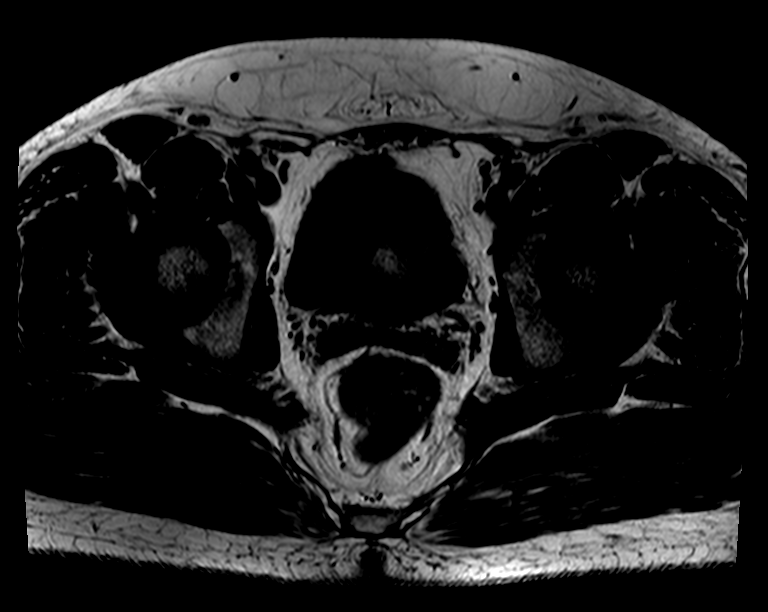

[12 of 40 positions shown; findings below may reference images not displayed]

FINDINGS: Bones: Marrow signal is normal throughout the hips and pelvis. No
fracture or stress change is identified. No avascular necrosis of
the femoral heads is present. Degenerative endplate signal change is
seen at L4-5 where there appears to be loss of disc space height.

Articular cartilage and labrum

Articular cartilage:  Unremarkable.

Labrum:  Intact.

Joint or bursal effusion

Joint effusion:  None.

Bursae:  Unremarkable.

Muscles and tendons

Muscles and tendons:  Intact and normal in appearance.

Other findings

Miscellaneous:  None.
IMPRESSION: Normal-appearing left hip.

Lower lumbar degenerative disease.

## 2017-04-08 ENCOUNTER — Other Ambulatory Visit: Payer: Self-pay | Admitting: Cardiovascular Disease

## 2017-04-11 ENCOUNTER — Other Ambulatory Visit: Payer: Self-pay | Admitting: Cardiovascular Disease

## 2017-04-11 NOTE — Telephone Encounter (Signed)
REFILL 

## 2017-05-19 ENCOUNTER — Other Ambulatory Visit: Payer: Self-pay | Admitting: Cardiovascular Disease

## 2017-07-06 ENCOUNTER — Other Ambulatory Visit: Payer: Self-pay | Admitting: Family Medicine

## 2017-07-06 DIAGNOSIS — K219 Gastro-esophageal reflux disease without esophagitis: Secondary | ICD-10-CM

## 2017-07-06 NOTE — Telephone Encounter (Signed)
Pt due for CPE in June. One additional refill only wll be provided. It appears my name has been removed from his PCP, uncertain if he has established somewhere else

## 2017-10-18 ENCOUNTER — Other Ambulatory Visit: Payer: Self-pay | Admitting: Endocrinology

## 2017-10-18 NOTE — Telephone Encounter (Signed)
Please refill x 1 Ov is due  

## 2017-10-18 NOTE — Telephone Encounter (Signed)
Last ov 01/05/16 1 canceled and no future appointment scheduled refill or refuse please advise

## 2017-11-24 ENCOUNTER — Other Ambulatory Visit: Payer: Self-pay | Admitting: Family Medicine

## 2017-12-08 ENCOUNTER — Other Ambulatory Visit: Payer: Self-pay | Admitting: Endocrinology

## 2017-12-08 NOTE — Telephone Encounter (Signed)
Please refill x 1 Ov is due  

## 2018-03-14 ENCOUNTER — Other Ambulatory Visit: Payer: Self-pay | Admitting: Cardiovascular Disease

## 2018-03-14 ENCOUNTER — Other Ambulatory Visit: Payer: Self-pay | Admitting: Cardiology

## 2018-03-15 ENCOUNTER — Other Ambulatory Visit: Payer: Self-pay | Admitting: Family Medicine

## 2018-03-15 DIAGNOSIS — E039 Hypothyroidism, unspecified: Secondary | ICD-10-CM

## 2018-04-11 ENCOUNTER — Other Ambulatory Visit: Payer: Self-pay | Admitting: Cardiovascular Disease

## 2018-07-21 ENCOUNTER — Other Ambulatory Visit: Payer: Self-pay | Admitting: Cardiovascular Disease

## 2018-12-02 ENCOUNTER — Other Ambulatory Visit: Payer: Self-pay

## 2018-12-02 ENCOUNTER — Emergency Department (INDEPENDENT_AMBULATORY_CARE_PROVIDER_SITE_OTHER)
Admission: EM | Admit: 2018-12-02 | Discharge: 2018-12-02 | Disposition: A | Discharge status: Home / Self Care | Payer: BC Managed Care – PPO | Source: Home / Self Care

## 2018-12-02 DIAGNOSIS — L309 Dermatitis, unspecified: Secondary | ICD-10-CM

## 2018-12-02 DIAGNOSIS — R220 Localized swelling, mass and lump, head: Secondary | ICD-10-CM

## 2018-12-02 DIAGNOSIS — R369 Urethral discharge, unspecified: Secondary | ICD-10-CM

## 2018-12-02 DIAGNOSIS — Z202 Contact with and (suspected) exposure to infections with a predominantly sexual mode of transmission: Secondary | ICD-10-CM

## 2018-12-02 DIAGNOSIS — N481 Balanitis: Secondary | ICD-10-CM

## 2018-12-02 MED ORDER — CEFTRIAXONE SODIUM 500 MG IJ SOLR
500.0000 mg | Freq: Once | INTRAMUSCULAR | Status: AC
Start: 2018-12-02 — End: 2018-12-02
  Administered 2018-12-02: 500 mg via INTRAMUSCULAR

## 2018-12-02 MED ORDER — AZITHROMYCIN 250 MG PO TABS: ORAL_TABLET | ORAL | 0 refills | Status: DC

## 2018-12-02 MED ORDER — FLUCONAZOLE 200 MG PO TABS: 200.0000 mg | ORAL_TABLET | Freq: Every day | ORAL | 0 refills | Status: AC

## 2018-12-02 MED ORDER — VALACYCLOVIR HCL 1 G PO TABS: 1000.0000 mg | ORAL_TABLET | Freq: Two times a day (BID) | ORAL | 0 refills | Status: AC

## 2018-12-02 NOTE — ED Triage Notes (Signed)
Pt here today for STD testing. Says he has had penile discharge for 1 week. Also c/o pain/infection in area where he has testosterone "pellets" implanted every 75mos.

## 2018-12-02 NOTE — ED Provider Notes (Signed)
Jeremiah Long    CSN: [id number] Arrival date & time: 12/02/18  1231     History   Chief Complaint Chief Complaint  Patient presents with  . Penile Discharge    HPI Ovid Curdlejandro E Mctavish is a 43 y.o. male.   HPI  Past Medical History:  Diagnosis Date  . Allergy   . Anemia   . Arthritis   . Asthma   . Chronic gout 11/02/2011  . Depression 12/16/2011  . Diabetes mellitus type 2 in obese (HCC) 11/02/2011  . Edema 12/16/2011  . Hx of adenomatous colonic polyps 11/02/2011  . Hyperlipidemia LDL goal <160 11/02/2011  . Hypogonadism male 11/02/2011  . Hypothyroidism 11/02/2011  . Obesity, Class II, BMI 35-39.9, with comorbidity 12/16/2011  . Right C6 radiculopathy 12/16/2011    Patient Active Problem List   Diagnosis Date Noted  . BMI 36.0-36.9,adult 11/02/2016  . Seasonal allergic rhinitis 10/27/2016  . Gastroesophageal reflux disease without esophagitis 10/27/2016  . Lumbar radiculopathy 01/05/2016  . Encounter for long-term (current) use of medications 08/26/2015  . Vitamin D deficiency 07/16/2013  . Hypertriglyceridemia 12/22/2012  . Osteoarthritis of right hip 08/10/2012  . Right C6 radiculopathy 12/16/2011  . Diabetes mellitus type 2 in obese (HCC) 11/02/2011  . Hypogonadism male 11/02/2011  . Hx of adenomatous colonic polyps 11/02/2011  . Hyperlipidemia LDL goal <130 11/02/2011  . Hypothyroidism 11/02/2011    Past Surgical History:  Procedure Laterality Date  . SHOULDER SURGERY Left   . SPINE SURGERY  2009   "decompression"  . TONSILLECTOMY         Home Medications    Prior to Admission medications   Medication Sig Start Date End Date Taking? Authorizing Provider  cephALEXin (KEFLEX) 500 MG capsule Take 500 mg by mouth 3 (three) times daily.   Yes [provider]  predniSONE (DELTASONE) 10 MG tablet Take 10 mg by mouth daily with breakfast.   Yes [provider]  sulfamethoxazole-trimethoprim (BACTRIM) 200-40 MG/5ML suspension Take  by mouth 2 (two) times daily.   Yes [provider]  albuterol (PROVENTIL HFA;VENTOLIN HFA) 108 (90 Base) MCG/ACT inhaler Inhale 2 puffs into the lungs every 6 (six) hours as needed for wheezing. 11/07/15   Kuneff, Renee A, DO  atorvastatin (LIPITOR) 80 MG tablet TAKE 1 TABLET (80 MG TOTAL) BY MOUTH DAILY. 11/07/15   Kuneff, Renee A, DO  azithromycin (ZITHROMAX) 250 MG tablet Take 4 pills single dose together today for possible STD 12/02/18   Peyton NajjarHopper,  H, MD  BAYER MICROLET LANCETS lancets Use to check blood sugar 1 time per day. 07/16/15   Romero BellingEllison, Sean, MD  Cholecalciferol (VITAMIN D-3) 1000 units CAPS Take by mouth.    [provider]  fenofibrate (TRICOR) 145 MG tablet Take 1 tablet (145 mg total) by mouth daily. 11/03/16   Kuneff, Renee A, DO  fluconazole (DIFLUCAN) 200 MG tablet Take 1 tablet (200 mg total) by mouth daily for 5 days. 12/02/18 12/07/18  Peyton NajjarHopper,  H, MD  glucose blood (FREESTYLE LITE) test strip 1 each by Other route daily. And lancets 1/day 10/03/15   Romero BellingEllison, Sean, MD  JANUVIA 100 MG tablet TAKE 1 TABLET DAILY 10/23/16   Romero BellingEllison, Sean, MD  metFORMIN (GLUCOPHAGE-XR) 500 MG 24 hr tablet TAKE 2 TABLETS WITH BREAKFAST AND 1 TABLET WITH DINNER 12/09/17   Romero BellingEllison, Sean, MD  montelukast (SINGULAIR) 10 MG tablet Take 1 tablet (10 mg total) by mouth at bedtime. 10/27/16   Kuneff, Renee A, DO  omeprazole (  PRILOSEC) 40 MG capsule TAKE 1 CAPSULE DAILY 07/06/17   Kuneff, Renee A, DO  oxyCODONE-acetaminophen (PERCOCET) 7.5-325 MG tablet Take 1 tablet by mouth 2 (two) times daily as needed (neck pain). 09/26/15   Kuneff, Renee A, DO  phentermine 37.5 MG capsule Take 37.5 mg by mouth every morning.    [provider]  testosterone cypionate (DEPOTESTOSTERONE CYPIONATE) 200 MG/ML injection INJ 1 ML IM Q 15 DAYS 06/26/18   [provider]  torsemide (DEMADEX) 20 MG tablet TAKE 2 TABLETS(40 MG) BY MOUTH DAILY 07/21/18   Croitoru, Mihai, MD  valACYclovir (VALTREX) 1000  MG tablet Take 1 tablet (1,000 mg total) by mouth 2 (two) times daily for 7 days. 12/02/18 12/09/18  Peyton NajjarHopper,  H, MD    Family History Family History  Problem Relation Age of Onset  . Stroke Mother   . Arthritis Mother   . Hearing loss Mother   . Diabetes Father   . Early death Father [id number]       Diabetes complications  . Diabetes Sister   . Thyroid disease Neg Hx     Social History Social History   Tobacco Use  . Smoking status: Light Tobacco Smoker    Types: Cigars  . Smokeless tobacco: Former NeurosurgeonUser    Types: Chew    Quit date: 05/17/2001  Substance Use Topics  . Alcohol use: No    Alcohol/week: 0.0 standard drinks  . Drug use: No     Allergies   Other, Effexor xr [venlafaxine hcl er], Lexapro [escitalopram oxalate], Morphine and related, and Latex   Review of Systems Review of Systems   Physical Exam Triage Vital Signs ED Triage Vitals  Enc Vitals Group     BP 12/02/18 1259 136/82     Pulse Rate 12/02/18 1259 (!) 124     Resp 12/02/18 1259 18     Temp --      Temp src --      SpO2 12/02/18 1259 98 %     Weight 12/02/18 1300 255 lb (115.7 kg)     Height 12/02/18 1300 5\' 11"  (1.803 m)     Head Circumference --      Peak Flow --      Pain Score 12/02/18 1300 0     Pain Loc --      Pain Edu? --      Excl. in GC? --    No data found.  Updated Vital Signs BP 136/82 (BP Location: Left Arm)   Pulse (!) 124   Resp 18   Ht 5\' 11"  (1.803 m)   Wt 115.7 kg   SpO2 98%   BMI 35.57 kg/m   Visual Acuity Right Eye Distance:   Left Eye Distance:   Bilateral Distance:    Right Eye Near:   Left Eye Near:    Bilateral Near:     Physical Exam   UC Treatments / Results  Labs (all labs ordered are listed, but only abnormal results are displayed) Labs Reviewed  C. TRACHOMATIS/N. GONORRHOEAE RNA  RPR  HIV ANTIBODY (ROUTINE TESTING W REFLEX)  HSV(HERPES SIMPLEX VRS) I + II AB-IGG    EKG   Radiology No results found.  Procedures Procedures  (including critical Long time)  Medications Ordered in UC Medications  cefTRIAXone (ROCEPHIN) injection 500 mg (500 mg Intramuscular Given 12/02/18 1400)    Initial Impression / Assessment and Plan / UC Course  I have reviewed the triage vital signs and the nursing notes.  Pertinent labs & imaging results that were available during my Long of the patient were reviewed by me and considered in my medical decision making (see chart for details).      Final Clinical Impressions(s) / UC Diagnoses   Final diagnoses:  Penile discharge  Balanitis  Dermatitis  Possible exposure to STD  Lip swelling     Discharge Instructions     Difficult to explain all of this.  With the sexual exposure we need to test for STDs and do initial coverage.  With your history of diabetes and having been on antibiotics for the hip lesion, this could represent problems related to that.  A sulfa allergy could cause the swelling of the lip and the rash.  Killing off the normal bacteria around the pain is in a diabetic could cause a bad yeast infection there.  You are receiving an injection of ceftriaxone (Rocephin) for the possibility of gonorrhea.  Also take azithromycin 250 mg 4 pills single dose today for possible STD  Discontinue the sulfamethoxazole/trimethoprim (generic Bactrim) and discontinue the cephalexin.  If you develop any blisters on your lip we will need to begin you on Valtrex (valacyclovir), so hold that prescription to use if needed.  Take fluconazole 200 mg 1 daily for 5 days for the infected tip of penis for anti-yeast treatment.  Also continue using the clotrimazole ointment on the penis.      ED Prescriptions    Medication Sig Dispense Auth. Provider   fluconazole (DIFLUCAN) 200 MG tablet Take 1 tablet (200 mg total) by mouth daily for 5 days. 5 tablet Posey Boyer, MD   valACYclovir (VALTREX) 1000 MG tablet Take 1 tablet (1,000 mg total) by mouth 2 (two) times daily for 7  days. 14 tablet Posey Boyer, MD   azithromycin (ZITHROMAX) 250 MG tablet Take 4 pills single dose together today for possible STD 4 tablet Posey Boyer, MD     Controlled Substance Prescriptions [place] Controlled Substance Registry consulted? No   Posey Boyer, MD 12/02/18 [phone]

## 2018-12-02 NOTE — Discharge Instructions (Addendum)
Difficult to explain all of this.  With the sexual exposure we need to test for STDs and do initial coverage.  With your history of diabetes and having been on antibiotics for the hip lesion, this could represent problems related to that.  A sulfa allergy could cause the swelling of the lip and the rash.  Killing off the normal bacteria around the pain is in a diabetic could cause a bad yeast infection there.  You are receiving an injection of ceftriaxone (Rocephin) for the possibility of gonorrhea.  Also take azithromycin 250 mg 4 pills single dose today for possible STD  Discontinue the sulfamethoxazole/trimethoprim (generic Bactrim) and discontinue the cephalexin.  If you develop any blisters on your lip we will need to begin you on Valtrex (valacyclovir), so hold that prescription to use if needed.  Take fluconazole 200 mg 1 daily for 5 days for the infected tip of penis for anti-yeast treatment.  Also continue using the clotrimazole ointment on the penis.

## 2018-12-04 LAB — HIV ANTIBODY (ROUTINE TESTING W REFLEX): HIV 1&2 Ab, 4th Generation: NONREACTIVE

## 2018-12-04 LAB — HSV(HERPES SIMPLEX VRS) I + II AB-IGG
HAV 1 IGG,TYPE SPECIFIC AB: 9.47 index — ABNORMAL HIGH
HSV 2 IGG,TYPE SPECIFIC AB: <0.9 index

## 2018-12-04 LAB — RPR: RPR Ser Ql: NONREACTIVE

## 2018-12-05 ENCOUNTER — Telehealth: Payer: Self-pay

## 2018-12-05 LAB — C. TRACHOMATIS/N. GONORRHOEAE RNA
C. trachomatis RNA, TMA: NOT DETECTED
N. gonorrhoeae RNA, TMA: NOT DETECTED

## 2018-12-05 NOTE — Telephone Encounter (Signed)
Spoke with patient and gave lab results.  Will follow up as needed.

## 2019-01-13 ENCOUNTER — Emergency Department (INDEPENDENT_AMBULATORY_CARE_PROVIDER_SITE_OTHER)
Admission: EM | Admit: 2019-01-13 | Discharge: 2019-01-13 | Disposition: A | Discharge status: Home / Self Care | Payer: BC Managed Care – PPO | Source: Home / Self Care | Attending: Family Medicine | Admitting: Family Medicine

## 2019-01-13 ENCOUNTER — Other Ambulatory Visit: Payer: Self-pay

## 2019-01-13 ENCOUNTER — Encounter: Payer: Self-pay | Admitting: Emergency Medicine

## 2019-01-13 DIAGNOSIS — Z0189 Encounter for other specified special examinations: Secondary | ICD-10-CM

## 2019-01-13 NOTE — ED Triage Notes (Signed)
Patient will be travelling to [place] 01/15/19 and needs COVID documentation for trip; informed him we cannot guarantee results by then; he wants to proceed. He has no signs/symptoms of COVID nor has he any known exposure.

## 2019-01-13 NOTE — Discharge Instructions (Addendum)
If COVID symptoms develop, isolate yourself until the below conditions are met: 1)  At least 7 days since symptoms onset. AND 2)  > 72 hours after symptom resolution (absence of fever without the use of fever-reducing medicine, and improvement in respiratory symptoms.

## 2019-01-13 NOTE — ED Provider Notes (Signed)
[facility]    CSN: [id number] Arrival date & time: 01/13/19  1155      History   Chief Complaint Chief Complaint  Patient presents with  . covid travel requirement    HPI KETIH GOODIE is a 43 y.o. male.   Patient will be travelling to [place] 01/15/19 and needs COVID testing and documentation for trip.  He feels well and has no symptoms.  The history is provided by the patient.    Past Medical History:  Diagnosis Date  . Allergy   . Anemia   . Arthritis   . Asthma   . Chronic gout 11/02/2011  . Depression 12/16/2011  . Diabetes mellitus type 2 in obese ([place]) 11/02/2011  . Edema 12/16/2011  . Hx of adenomatous colonic polyps 11/02/2011  . Hyperlipidemia LDL goal <160 11/02/2011  . Hypogonadism male 11/02/2011  . Hypothyroidism 11/02/2011  . Obesity, Class II, BMI 35-39.9, with comorbidity 12/16/2011  . Right C6 radiculopathy 12/16/2011    Patient Active Problem List   Diagnosis Date Noted  . BMI 36.0-36.9,adult 11/02/2016  . Seasonal allergic rhinitis 10/27/2016  . Gastroesophageal reflux disease without esophagitis 10/27/2016  . Lumbar radiculopathy 01/05/2016  . Encounter for long-term (current) use of medications 08/26/2015  . Vitamin D deficiency 07/16/2013  . Hypertriglyceridemia 12/22/2012  . Osteoarthritis of right hip 08/10/2012  . Right C6 radiculopathy 12/16/2011  . Diabetes mellitus type 2 in obese ([facility]) 11/02/2011  . Hypogonadism male 11/02/2011  . Hx of adenomatous colonic polyps 11/02/2011  . Hyperlipidemia LDL goal <130 11/02/2011  . Hypothyroidism 11/02/2011    Past Surgical History:  Procedure Laterality Date  . SHOULDER SURGERY Left   . [place]  2009   "decompression"  . TONSILLECTOMY         Home Medications    Prior to Admission medications   Medication Sig Start Date End Date Taking? Authorizing Provider  albuterol (PROVENTIL HFA;VENTOLIN HFA) 108 (90 Base) MCG/ACT inhaler Inhale 2 puffs into the lungs  every 6 (six) hours as needed for wheezing. 11/07/15   Kuneff, Renee A, DO  atorvastatin (LIPITOR) 80 MG tablet TAKE 1 TABLET (80 MG TOTAL) BY MOUTH DAILY. 11/07/15   Kuneff, Renee A, DO  azithromycin (ZITHROMAX) 250 MG tablet Take 4 pills single dose together today for possible STD 12/02/18   Posey Boyer, MD  BAYER MICROLET LANCETS lancets Use to check blood sugar 1 time per day. 07/16/15   Renato Shin, MD  cephALEXin (KEFLEX) 500 MG capsule Take 500 mg by mouth 3 (three) times daily.    [provider]  Cholecalciferol (VITAMIN D-3) 1000 units CAPS Take by mouth.    [provider]  fenofibrate (TRICOR) 145 MG tablet Take 1 tablet (145 mg total) by mouth daily. 11/03/16   Kuneff, Renee A, DO  glucose blood (FREESTYLE LITE) test strip 1 each by Other route daily. And lancets 1/day 10/03/15   Renato Shin, MD  JANUVIA 100 MG tablet TAKE 1 TABLET DAILY 10/23/16   Renato Shin, MD  metFORMIN (GLUCOPHAGE-XR) 500 MG 24 hr tablet TAKE 2 TABLETS WITH BREAKFAST AND 1 TABLET WITH DINNER 12/09/17   Renato Shin, MD  montelukast (SINGULAIR) 10 MG tablet Take 1 tablet (10 mg total) by mouth at bedtime. 10/27/16   Kuneff, Renee A, DO  omeprazole (PRILOSEC) 40 MG capsule TAKE 1 CAPSULE DAILY 07/06/17   Kuneff, Renee A, DO  oxyCODONE-acetaminophen (PERCOCET) 7.5-325 MG tablet Take 1 tablet by mouth 2 (two) times  daily as needed (neck pain). 09/26/15   Kuneff, Renee A, DO  phentermine 37.5 MG capsule Take 37.5 mg by mouth every morning.    [provider]  predniSONE (DELTASONE) 10 MG tablet Take 10 mg by mouth daily with breakfast.    [provider]  sulfamethoxazole-trimethoprim (BACTRIM) 200-40 MG/5ML suspension Take by mouth 2 (two) times daily.    [provider]  testosterone cypionate (DEPOTESTOSTERONE CYPIONATE) 200 MG/ML injection INJ 1 ML IM Q 15 DAYS 06/26/18   [provider]  torsemide (DEMADEX) 20 MG tablet TAKE 2 TABLETS(40 MG) BY MOUTH DAILY 07/21/18    Croitoru, Mihai, MD    Family History Family History  Problem Relation Age of Onset  . Stroke Mother   . Arthritis Mother   . Hearing loss Mother   . Diabetes Father   . Early death Father 40       Diabetes complications  . Diabetes Sister   . Thyroid disease Neg Hx     Social History Social History   Tobacco Use  . Smoking status: Light Tobacco Smoker    Types: Cigars  . Smokeless tobacco: Former Systems developer    Types: Chew    Quit date: 05/17/2001  Substance Use Topics  . Alcohol use: No    Alcohol/week: 0.0 standard drinks  . Drug use: No     Allergies   Other, Effexor xr [venlafaxine hcl er], Lexapro [escitalopram oxalate], Morphine and related, and Latex   Review of Systems Review of Systems No sore throat No cough No pleuritic pain No wheezing No nasal congestion No post-nasal drainage No sinus pain/pressure No itchy/red eyes No earache No hemoptysis No SOB No fever/chills No nausea No vomiting No abdominal pain No diarrhea No urinary symptoms No skin rash No fatigue No myalgias No headache  Physical Exam Triage Vital Signs ED Triage Vitals [01/13/19 1225]  Enc Vitals Group     BP 118/77     Pulse Rate 86     Resp 16     Temp 98 F (36.7 C)     Temp Source Oral     SpO2 96 %     Weight 245 lb (111.1 kg)     Height 5' 11"  (1.803 m)     Head Circumference      Peak Flow      Pain Score 0     Pain Loc      Pain Edu?      Excl. in [place]?    No data found.  Updated Vital Signs BP 118/77 (BP Location: Right Arm)   Pulse 86   Temp 98 F (36.7 C) (Oral)   Resp 16   Ht 5' 11"  (1.803 m)   Wt 111.1 kg   SpO2 96%   BMI 34.17 kg/m   Visual Acuity Right Eye Distance:   Left Eye Distance:   Bilateral Distance:    Right Eye Near:   Left Eye Near:    Bilateral Near:     Physical Exam Nursing notes and Vital Signs reviewed. Appearance:  Patient appears stated age, and in no acute distress Eyes:  Pupils are equal, round, and reactive to  light and accomodation.  Extraocular movement is intact.  Conjunctivae are not inflamed  Ears:  Canals normal.  Tympanic membranes normal.  Nose: Normal turbinates.  No sinus tenderness.   Pharynx:  Normal Neck:  Supple. No adenopathy. Lungs:  Clear to auscultation.  Breath sounds are equal.  Moving air well.  Heart:  Regular rate and rhythm without murmurs, rubs, or gallops.  Abdomen:  Nontender without masses or hepatosplenomegaly.  Bowel sounds are present.  No CVA or flank tenderness.  Extremities:  No edema.  Skin:  No rash present.     UC Treatments / Results  Labs (all labs ordered are listed, but only abnormal results are displayed) Labs Reviewed  NOVEL CORONAVIRUS, NAA    EKG   Radiology No results found.  Procedures Procedures (including critical care time)  Medications Ordered in UC Medications - No data to display  Initial Impression / Assessment and Plan / UC Course  I have reviewed the triage vital signs and the nursing notes.  Pertinent labs & imaging results that were available during my care of the patient were reviewed by me and considered in my medical decision making (see chart for details).    Patient has no evidence of illness. COVID19 test pending.   Final Clinical Impressions(s) / UC Diagnoses   Final diagnoses:  Encounter for laboratory test     Discharge Instructions      If COVID symptoms develop, isolate yourself until the below conditions are met: 1)  At least 7 days since symptoms onset. AND 2)  > 72 hours after symptom resolution (absence of fever without the use of fever-reducing medicine, and improvement in respiratory symptoms.       ED Prescriptions    None        Kandra Nicolas, MD 01/15/19 1215

## 2019-01-15 LAB — NOVEL CORONAVIRUS, NAA: SARS-CoV-2, NAA: NOT DETECTED

## 2019-02-07 ENCOUNTER — Ambulatory Visit: Payer: BC Managed Care – PPO | Admitting: Cardiovascular Disease

## 2019-03-05 ENCOUNTER — Other Ambulatory Visit: Payer: Self-pay

## 2019-03-05 ENCOUNTER — Emergency Department (INDEPENDENT_AMBULATORY_CARE_PROVIDER_SITE_OTHER)
Admission: EM | Admit: 2019-03-05 | Discharge: 2019-03-05 | Disposition: A | Discharge status: Home / Self Care | Payer: BC Managed Care – PPO | Source: Home / Self Care | Attending: Emergency Medicine | Admitting: Emergency Medicine

## 2019-03-05 DIAGNOSIS — K219 Gastro-esophageal reflux disease without esophagitis: Secondary | ICD-10-CM

## 2019-03-05 DIAGNOSIS — R5383 Other fatigue: Secondary | ICD-10-CM

## 2019-03-05 DIAGNOSIS — R059 Cough, unspecified: Secondary | ICD-10-CM

## 2019-03-05 DIAGNOSIS — J0100 Acute maxillary sinusitis, unspecified: Secondary | ICD-10-CM | POA: Diagnosis not present

## 2019-03-05 DIAGNOSIS — R519 Headache, unspecified: Secondary | ICD-10-CM

## 2019-03-05 DIAGNOSIS — R05 Cough: Secondary | ICD-10-CM

## 2019-03-05 MED ORDER — ESOMEPRAZOLE MAGNESIUM 40 MG PO CPDR: 40.0000 mg | DELAYED_RELEASE_CAPSULE | Freq: Every day | ORAL | 0 refills | Status: AC

## 2019-03-05 MED ORDER — BECONASE AQ 42 MCG/SPRAY NA SUSP: 2.0000 | Freq: Two times a day (BID) | NASAL | 1 refills | Status: AC

## 2019-03-05 MED ORDER — AMOXICILLIN 875 MG PO TABS: ORAL_TABLET | ORAL | 0 refills | Status: AC

## 2019-03-05 MED ORDER — ONDANSETRON 4 MG PO TBDP: 4.0000 mg | ORAL_TABLET | Freq: Three times a day (TID) | ORAL | 0 refills | Status: AC | PRN

## 2019-03-05 MED ORDER — ONDANSETRON 4 MG PO TBDP
4.0000 mg | ORAL_TABLET | Freq: Once | ORAL | Status: AC
  Administered 2019-03-05: 4 mg via ORAL

## 2019-03-05 NOTE — ED Triage Notes (Signed)
The past few days, bad headaches.  Today worse.  Heartburn comes and goes.  Is on medication for it.  Nausea for 2 days.

## 2019-03-05 NOTE — Discharge Instructions (Addendum)
You have a variety of different symptoms.  Could be from a sinus infection, and I am prescribing amoxicillin and a prescription nasal spray for this.-You need to stop using the Neo-Synephrine nasal spray. Your mild cough and fatigue and headache could be from sinus infection, but we are sending off a Covid 19 test to be on the safe side.  In the meantime, no work until [employer] test comes back. For nausea, we gave you Zofran under the tongue here in urgent care.  I sent a prescription for more Zofran to your pharmacy if needed for nausea. Your reflux is breaking through, even on omeprazole, so for now I would change to Nexium 40 mg daily.  Prescription sent to your pharmacy. You said you ran out of Januvia, so you need to call your PCP to get back on that today. For your headache, I do not have any new medicine.  You are already on Percocet which has a narcotic and Tylenol in it.  We are staying away from NSAIDs as that could make your reflux worse.-With rest and plenty of fluids and the above treatment plan, I would expect your headache to improve, but if headache becomes severe, you need to go to emergency room You need to make an appointment to follow-up with your PCP in 1 week to reevaluate the above problems.

## 2019-03-05 NOTE — ED Provider Notes (Signed)
[facility]    CSN: [id number] Arrival date & time: 03/05/19  1100      History   Chief Complaint Chief Complaint  Patient presents with  . Headache  . Nausea  . Heartburn    HPI Jeremiah Long is a 43 y.o. male.   HPI He has several different chief complaints today.  He is not sure if they are all related. The past few days, bitemporal, moderate intensity headaches.    Headaches are bitemporal, 5 or 6 out of 10 without any focal neurologic symptoms.  No new focal weakness or numbness. Heartburn comes and goes.  Describes it as a burning sensation from epigastric area that comes up to the lower chest.  No definite chest pain. Is on omeprazole 40 mg daily for GERD.-He feels the omeprazole is not working well enough and is having breakthrough symptoms.  Nausea for 2 days.  No vomiting.  Able to tolerate p.o.'s.  No diarrhea or bright red blood per rectum or melena or change of bowel habits. He states he may have felt a little lightheaded yesterday for 1 second after squatting down, but that resolved.  No loss of consciousness.  No lightheadedness today.  No loss of consciousness or seizures. He denies fever or chills.  Denies new rash. He has a mild nonproductive cough, feels a sense of taste and smell has changed. He states his fasting glucose at home 2 days ago was 187.  He states fasting glucose this morning 111.  He just ran out of his Januvia, and I advised him to contact his PCP to refill today. Denies history of hypoglycemia on current treatment regimen for type 2 diabetes.  Is not on insulin. Complains of sinus congestion bilaterally for a week with the headache with slight discolored rhinorrhea.  He feels lymph nodes are swollen and neck with this illness.  Feels mild dysphagia. Denies chest pain or shortness of breath.  History of asthma, he tried albuterol HHN yesterday and that did not make a difference. Complains of mild nonproductive cough.  Mild fatigue  for several days.  No definite new myalgias.  Past Medical History:  Diagnosis Date  . Allergy   . Anemia   . Arthritis   . Asthma   . Chronic gout 11/02/2011  . Depression 12/16/2011  . Diabetes mellitus type 2 in obese ([facility]) 11/02/2011  . Edema 12/16/2011  . Hx of adenomatous colonic polyps 11/02/2011  . Hyperlipidemia LDL goal <160 11/02/2011  . Hypogonadism male 11/02/2011  . Hypothyroidism 11/02/2011  . Obesity, Class II, BMI 35-39.9, with comorbidity 12/16/2011  . Right C6 radiculopathy 12/16/2011    Patient Active Problem List   Diagnosis Date Noted  . BMI 36.0-36.9,adult 11/02/2016  . Seasonal allergic rhinitis 10/27/2016  . Gastroesophageal reflux disease without esophagitis 10/27/2016  . Lumbar radiculopathy 01/05/2016  . Encounter for long-term (current) use of medications 08/26/2015  . Vitamin D deficiency 07/16/2013  . Hypertriglyceridemia 12/22/2012  . Osteoarthritis of right hip 08/10/2012  . Right C6 radiculopathy 12/16/2011  . Diabetes mellitus type 2 in obese ([facility]) 11/02/2011  . Hypogonadism male 11/02/2011  . Hx of adenomatous colonic polyps 11/02/2011  . Hyperlipidemia LDL goal <130 11/02/2011  . Hypothyroidism 11/02/2011    Past Surgical History:  Procedure Laterality Date  . SHOULDER SURGERY Left   . SPINE SURGERY  2009   "decompression"  . TONSILLECTOMY         Home Medications    Prior to Admission medications  Medication Sig Start Date End Date Taking? Authorizing Provider  albuterol (PROVENTIL HFA;VENTOLIN HFA) 108 (90 Base) MCG/ACT inhaler Inhale 2 puffs into the lungs every 6 (six) hours as needed for wheezing. 11/07/15   Claiborne Billings, Renee A, DO  amoxicillin (AMOXIL) 875 MG tablet Take 1 twice a day X 10 days. 03/05/19   Lajean Manes, MD  ARMOUR THYROID 60 MG tablet TK 1 T PO D 02/08/19   [provider]  atorvastatin (LIPITOR) 80 MG tablet TAKE 1 TABLET (80 MG TOTAL) BY MOUTH DAILY. 11/07/15   Kuneff, Renee A, DO  BAYER MICROLET LANCETS  lancets Use to check blood sugar 1 time per day. 07/16/15   Romero Belling, MD  beclomethasone (BECONASE AQ) 42 MCG/SPRAY nasal spray Place 2 sprays into both nostrils 2 (two) times daily. Dose is for each nostril. 03/05/19   Lajean Manes, MD  Cholecalciferol (VITAMIN D-3) 1000 units CAPS Take by mouth.    [provider]  esomeprazole (NEXIUM) 40 MG capsule Take 1 capsule (40 mg total) by mouth daily. 03/05/19   Lajean Manes, MD  furosemide (LASIX) 40 MG tablet TK 1 T PO D 02/08/19   [provider]  glucose blood (FREESTYLE LITE) test strip 1 each by Other route daily. And lancets 1/day 10/03/15   Romero Belling, MD  JANUVIA 100 MG tablet TAKE 1 TABLET DAILY 10/23/16   Romero Belling, MD  loratadine (CLARITIN) 10 MG tablet TK 1 T PO D 02/08/19   [provider]  metFORMIN (GLUCOPHAGE-XR) 500 MG 24 hr tablet TAKE 2 TABLETS WITH BREAKFAST AND 1 TABLET WITH DINNER 12/09/17   Romero Belling, MD  montelukast (SINGULAIR) 10 MG tablet Take 1 tablet (10 mg total) by mouth at bedtime. 10/27/16   Kuneff, Renee A, DO  omeprazole (PRILOSEC) 40 MG capsule TAKE 1 CAPSULE DAILY 07/06/17   Kuneff, Renee A, DO  ondansetron (ZOFRAN-ODT) 4 MG disintegrating tablet Take 1 tablet (4 mg total) by mouth every 8 (eight) hours as needed for nausea or vomiting. 03/05/19   Lajean Manes, MD  oxyCODONE-acetaminophen (PERCOCET) 7.5-325 MG tablet Take 1 tablet by mouth 2 (two) times daily as needed (neck pain). 09/26/15   Kuneff, Renee A, DO  phentermine 37.5 MG capsule Take 37.5 mg by mouth every morning.    [provider]  testosterone cypionate (DEPOTESTOSTERONE CYPIONATE) 200 MG/ML injection INJ 1 ML IM Q 15 DAYS 06/26/18   [provider]  torsemide (DEMADEX) 20 MG tablet TAKE 2 TABLETS(40 MG) BY MOUTH DAILY 07/21/18   Croitoru, Mihai, MD  fenofibrate (TRICOR) 145 MG tablet Take 1 tablet (145 mg total) by mouth daily. 11/03/16 03/05/19  Natalia Leatherwood, DO    Family History Family History   Problem Relation Age of Onset  . Stroke Mother   . Arthritis Mother   . Hearing loss Mother   . Diabetes Father   . Early death Father 58       Diabetes complications  . Diabetes Sister   . Thyroid disease Neg Hx     Social History Social History   Tobacco Use  . Smoking status: Light Tobacco Smoker    Types: Cigars  . Smokeless tobacco: Former Neurosurgeon    Types: Chew    Quit date: 05/17/2001  Substance Use Topics  . Alcohol use: No    Alcohol/week: 0.0 standard drinks  . Drug use: No     Allergies   Other, Effexor xr [venlafaxine hcl er], Lexapro [escitalopram oxalate], Morphine and related, and  Latex   Review of Systems Review of Systems Pertinent items noted in HPI and remainder of comprehensive ROS otherwise negative.   Physical Exam Triage Vital Signs ED Triage Vitals  Enc Vitals Group     BP 03/05/19 1116 (!) 139/99     Pulse Rate 03/05/19 1116 95     Resp 03/05/19 1116 20     Temp 03/05/19 1116 98 F (36.7 C)     Temp Source 03/05/19 1116 Tympanic     SpO2 03/05/19 1116 96 %     Weight 03/05/19 1118 265 lb (120.2 kg)     Height 03/05/19 1118 5\' 11"  (1.803 m)     Head Circumference --      Peak Flow --      Pain Score 03/05/19 1118 6     Pain Loc --      Pain Edu? --      Excl. in [place]? --    No data found.  Updated Vital Signs BP (!) 139/99 (BP Location: Right Arm)   Pulse 95   Temp 98 F (36.7 C) (Tympanic)   Resp 20   Ht 5\' 11"  (1.803 m)   Wt 120.2 kg   SpO2 96%   BMI 36.96 kg/m   Visual Acuity Right Eye Distance:   Left Eye Distance:   Bilateral Distance:    Right Eye Near:  Grossly intact Left Eye Near:   Grossly intact Bilateral Near:     Physical Exam Vitals signs and nursing note reviewed.  Constitutional:      General: He is not in acute distress.    Appearance: He is well-developed. He is obese.     Comments: Pleasant, cooperative male.  No acute distress.  He appears fatigued and mildly ill-appearing but not toxic.  Not  diaphoretic.  Alert.  HENT:     Head: Normocephalic and atraumatic.     Right Ear: Tympanic membrane, ear canal and external ear normal.     Left Ear: Tympanic membrane, ear canal and external ear normal.     Nose: Mucosal edema and rhinorrhea present.     Right Sinus: Maxillary sinus tenderness present.     Left Sinus: Maxillary sinus tenderness present.     Mouth/Throat:     Mouth: Mucous membranes are moist. No oral lesions.     Pharynx: No oropharyngeal exudate.  Eyes:     General: No scleral icterus.       Right eye: No discharge.        Left eye: No discharge.     Extraocular Movements:     Right eye: Normal extraocular motion and no nystagmus.     Left eye: Normal extraocular motion and no nystagmus.     Pupils: Pupils are equal, round, and reactive to light.  Neck:     Musculoskeletal: Neck supple. No neck rigidity.  Cardiovascular:     Rate and Rhythm: Normal rate and regular rhythm.     Heart sounds: Normal heart sounds. No murmur. No friction rub. No gallop.   Pulmonary:     Effort: Pulmonary effort is normal. No respiratory distress.     Breath sounds: Normal breath sounds. No stridor. No wheezing, rhonchi or rales.  Chest:     Chest wall: No tenderness.  Abdominal:     Palpations: Abdomen is soft. There is no mass.     Tenderness: There is no abdominal tenderness. There is no guarding.  Musculoskeletal:     Comments: No swelling  or tenderness of lower legs.  Lymphadenopathy:     Cervical: Cervical adenopathy (Mildly enlarged, rubbery, mobile, minimally tender anterior cervical nodes.) present.  Skin:    General: Skin is warm and dry.     Capillary Refill: Capillary refill takes less than 2 seconds.     Findings: No rash.  Neurological:     Mental Status: He is alert and oriented to person, place, and time.     Cranial Nerves: No cranial nerve deficit, dysarthria or facial asymmetry.     Sensory: No sensory deficit.  Psychiatric:        Behavior: Behavior  normal.      UC Treatments / Results  Labs (all labs ordered are listed, but only abnormal results are displayed) Labs Reviewed  SARS-COV-2 RNA, QUALITATIVE REAL-TIME RT-PCR    EKG   Radiology No results found.  Procedures Procedures (including critical care time)  Medications Ordered in UC Medications  ondansetron (ZOFRAN-ODT) disintegrating tablet 4 mg (4 mg Oral Given 03/05/19 1226)   Patient reevaluated after Zofran given, and his nausea improved.  Initial Impression / Assessment and Plan / UC Course  I have reviewed the triage vital signs and the nursing notes.  Pertinent labs & imaging results that were available during my care of the patient were reviewed by me and considered in my medical decision making (see chart for details).      Final Clinical Impressions(s) / UC Diagnoses   Final diagnoses:  Nonintractable headache, unspecified chronicity pattern, unspecified headache type  Acute maxillary sinusitis, recurrence not specified  Cough  Other fatigue  Gastroesophageal reflux disease, unspecified whether esophagitis present     Discharge Instructions     You have a variety of different symptoms.  Could be from a sinus infection, and I am prescribing amoxicillin and a prescription nasal spray for this.-You need to stop using the Neo-Synephrine nasal spray. Your mild cough and fatigue and headache could be from sinus infection, but we are sending off a Covid 19 test to be on the safe side.  In the meantime, no work until [employer] test comes back. For nausea, we gave you Zofran under the tongue here in urgent care.  I sent a prescription for more Zofran to your pharmacy if needed for nausea. Your reflux is breaking through, even on omeprazole, so for now I would change to Nexium 40 mg daily.  Prescription sent to your pharmacy. You said you ran out of Januvia, so you need to call your PCP to get back on that today. For your headache, I do not have any new  medicine.  You are already on Percocet which has a narcotic and Tylenol in it.  We are staying away from NSAIDs as that could make your reflux worse.-With rest and plenty of fluids and the above treatment plan, I would expect your headache to improve, but if headache becomes severe, you need to go to emergency room You need to make an appointment to follow-up with your PCP in 1 week to reevaluate the above problems.    Explained that he needs to quarantine until Covid test comes back ED Prescriptions    Medication Sig Dispense Auth. Provider   amoxicillin (AMOXIL) 875 MG tablet Take 1 twice a day X 10 days. 20 tablet Jacqulyn Cane, MD   beclomethasone (BECONASE AQ) 42 MCG/SPRAY nasal spray Place 2 sprays into both nostrils 2 (two) times daily. Dose is for each nostril. 25 g Jacqulyn Cane, MD   ondansetron (ZOFRAN-ODT)  4 MG disintegrating tablet Take 1 tablet (4 mg total) by mouth every 8 (eight) hours as needed for nausea or vomiting. 5 tablet Lajean Manes, MD   esomeprazole (NEXIUM) 40 MG capsule Take 1 capsule (40 mg total) by mouth daily. 15 capsule Lajean Manes, MD     PDMP not reviewed this encounter.   Lajean Manes, MD 03/05/19 [phone]

## 2019-03-09 LAB — SARS-COV-2 RNA,(COVID-19) QUALITATIVE NAAT: SARS CoV2 RNA: NOT DETECTED

## 2019-03-10 ENCOUNTER — Telehealth: Payer: Self-pay | Admitting: Emergency Medicine

## 2019-03-10 NOTE — Telephone Encounter (Signed)
Patient feeling better. Gave him negative COVID test results.

## 2019-05-09 ENCOUNTER — Other Ambulatory Visit: Payer: Self-pay | Admitting: Family Medicine
